# Patient Record
Sex: Female | Born: 1970 | Race: Black or African American | Hispanic: No | Smoking: Never smoker
Health system: Southern US, Community
[De-identification: ages and names within clinical notes are randomized; demographics above are authoritative.]

## PROBLEM LIST (undated history)

## (undated) DIAGNOSIS — R6520 Severe sepsis without septic shock: Secondary | ICD-10-CM

## (undated) DIAGNOSIS — J690 Pneumonitis due to inhalation of food and vomit: Secondary | ICD-10-CM

## (undated) DIAGNOSIS — I10 Essential (primary) hypertension: Secondary | ICD-10-CM

## (undated) DIAGNOSIS — J9621 Acute and chronic respiratory failure with hypoxia: Secondary | ICD-10-CM

## (undated) DIAGNOSIS — A419 Sepsis, unspecified organism: Secondary | ICD-10-CM

## (undated) DIAGNOSIS — N189 Chronic kidney disease, unspecified: Secondary | ICD-10-CM

## (undated) DIAGNOSIS — N179 Acute kidney failure, unspecified: Secondary | ICD-10-CM

## (undated) DIAGNOSIS — E119 Type 2 diabetes mellitus without complications: Secondary | ICD-10-CM

## (undated) DIAGNOSIS — B259 Cytomegaloviral disease, unspecified: Secondary | ICD-10-CM

## (undated) HISTORY — DX: Sepsis, unspecified organism: R65.20

## (undated) HISTORY — DX: Chronic kidney disease, unspecified: N18.9

## (undated) HISTORY — DX: Acute and chronic respiratory failure with hypoxia: J96.21

## (undated) HISTORY — DX: Pneumonitis due to inhalation of food and vomit: J69.0

## (undated) HISTORY — DX: Cytomegaloviral disease, unspecified: B25.9

## (undated) HISTORY — DX: Sepsis, unspecified organism: A41.9

## (undated) HISTORY — DX: Acute kidney failure, unspecified: N17.9

---

## 2003-07-24 ENCOUNTER — Other Ambulatory Visit: Payer: Self-pay

## 2003-10-17 ENCOUNTER — Other Ambulatory Visit: Payer: Self-pay

## 2006-07-13 ENCOUNTER — Emergency Department: Payer: Self-pay | Admitting: Emergency Medicine

## 2006-07-26 ENCOUNTER — Other Ambulatory Visit: Payer: Self-pay

## 2006-07-26 ENCOUNTER — Emergency Department: Payer: Self-pay | Admitting: Emergency Medicine

## 2006-09-15 ENCOUNTER — Emergency Department: Payer: Self-pay

## 2006-11-14 ENCOUNTER — Emergency Department: Payer: Self-pay | Admitting: Emergency Medicine

## 2007-08-04 HISTORY — PX: HEART TRANSPLANT: SHX268

## 2007-08-04 HISTORY — PX: NEPHRECTOMY TRANSPLANTED ORGAN: SUR880

## 2011-11-05 ENCOUNTER — Ambulatory Visit: Payer: Self-pay | Admitting: Obstetrics and Gynecology

## 2012-11-15 ENCOUNTER — Ambulatory Visit: Payer: Self-pay | Admitting: Obstetrics and Gynecology

## 2014-04-10 ENCOUNTER — Ambulatory Visit: Payer: Self-pay | Admitting: Obstetrics and Gynecology

## 2014-05-10 ENCOUNTER — Emergency Department: Payer: Self-pay | Admitting: Emergency Medicine

## 2015-04-01 ENCOUNTER — Other Ambulatory Visit: Payer: Self-pay | Admitting: Obstetrics and Gynecology

## 2015-04-01 DIAGNOSIS — Z1231 Encounter for screening mammogram for malignant neoplasm of breast: Secondary | ICD-10-CM

## 2015-04-04 ENCOUNTER — Encounter: Payer: BLUE CROSS/BLUE SHIELD | Attending: Family Medicine | Admitting: Dietician

## 2015-04-04 ENCOUNTER — Encounter: Payer: Self-pay | Admitting: Dietician

## 2015-04-04 VITALS — Ht 59.0 in | Wt 90.3 lb

## 2015-04-04 DIAGNOSIS — E1040 Type 1 diabetes mellitus with diabetic neuropathy, unspecified: Secondary | ICD-10-CM | POA: Diagnosis not present

## 2015-04-04 DIAGNOSIS — I10 Essential (primary) hypertension: Secondary | ICD-10-CM

## 2015-04-04 DIAGNOSIS — E785 Hyperlipidemia, unspecified: Secondary | ICD-10-CM

## 2015-04-04 DIAGNOSIS — E119 Type 2 diabetes mellitus without complications: Secondary | ICD-10-CM | POA: Insufficient documentation

## 2015-04-04 NOTE — Progress Notes (Signed)
Medical Nutrition Therapy: Visit start time: 1615  end time: 1730   Assessment:  Diagnosis: Hyperlipidemia, Diabetes (on pump), HTN Past medical history: heart and kidney transplant 2009, GERD, osteoarthritis, avascular necrosis of bone in right hip, hyperthyroidism Psychosocial issues/ stress concerns: patient reports high stress level, taking meds. History of anxiety and depression. Preferred learning method:  . Visual  Current weight: 90.3lbs  Height: 4'11" Medications, supplements: reviewed list in chart with patient Progress and evaluation: Lab results indicate total cholesterol of 264, LDL 195, HDL 40.5, TG 141.    Patient reports she does not eat healthy, does not like fruits, nuts, yogurt, peanut butter. Does like vegetables.    She reports living alone and has had frustration trying to cook for herself without wasting foods, so usually eats out.  Physical activity: none, hip pain from arthritis, necrosis of bone  Dietary Intake:  Usual eating pattern includes 3 meals and 0 snacks per day. Dining out frequency: multiple meals per week.  Breakfast: sometimes bacon, biscuits ([place]), eggs scrambled not many eggs, loves cheese.  Snack: none Lunch: [employer] 1 chicken wing, mashed potatoes, biscuit with gravy. Usually out Snack: none Supper: 8/31 cheese fries.  Snack: none Beverages: sweet tea, mt dew. No diet drinks.   Nutrition Care Education: Topics covered: hyperlipidemia, Diabetes, HTN Basic nutrition: basic food groups, appropriate nutrient balance, general nutrition guidelines    Diabetes:  appropriate carb intake and balance Hypertension:  identifying high sodium foods, identifying food sources of Calcium, potassium, magnesium Hyperlipidemia:  target goals for lipids, healthy and unhealthy fats, role of fiber, food sources of folate, phytochemicals   Nutritional Diagnosis:  NI-5.6.2 Excessive fat intake As related to regular intake of fried foods, biscuits,  processed meats, cheese.  As evidenced by patient report.  Intervention: Instruction as noted above.   Discussed importance of reducing saturated and trans fats, sodium, while increasing vegetables, whole grains.   Discussed simple meal options patient can prepare at home, and ways to limit waste.   Discussed importance of limiting sugary beverages for BG control as well as heart disease risk.     Education Materials given:  . General diet guidelines for Cholesterol-lowering/ Heart health . Food lists/ Planning A Balanced Meal . Sample meal pattern/ menus . Goals/ instructions  Learner/ who was taught:  . Patient   Level of understanding: Marland Kitchen Verbalizes/ demonstrates competency . Needs additional support in making gradual changes -- scheduled follow-up  Demonstrated degree of understanding via:   Teach back Learning barriers: . Hearing impairment; uses hearing aids  Willingness to learn/ readiness for change: . Acceptance, ready for change   Monitoring and Evaluation:  Dietary intake, BG control, cholesterol, and body weight      follow up: 05/06/15

## 2015-04-04 NOTE — Patient Instructions (Signed)
   Try Old Fashioned oats, mix 1/2 cup with 1/2 cup water or low fat milk and keep it in the fridge overnight. Sweeten it with Splenda and a small amount of fresh or frozen fruit.   Eat a vegetable several times a day.   Eat more baked and grilled foods, reduce fried foods.   Use drinks with Splenda, diet soda, or water.

## 2015-04-16 ENCOUNTER — Ambulatory Visit
Admission: RE | Admit: 2015-04-16 | Discharge: 2015-04-16 | Disposition: A | Discharge status: Home / Self Care | Payer: BLUE CROSS/BLUE SHIELD | Source: Ambulatory Visit | Attending: Obstetrics and Gynecology | Admitting: Obstetrics and Gynecology

## 2015-04-16 DIAGNOSIS — Z1231 Encounter for screening mammogram for malignant neoplasm of breast: Secondary | ICD-10-CM | POA: Insufficient documentation

## 2015-05-06 ENCOUNTER — Encounter: Payer: BLUE CROSS/BLUE SHIELD | Attending: Family Medicine | Admitting: Dietician

## 2015-05-06 VITALS — Ht 59.0 in | Wt 88.6 lb

## 2015-05-06 DIAGNOSIS — E1040 Type 1 diabetes mellitus with diabetic neuropathy, unspecified: Secondary | ICD-10-CM

## 2015-05-06 DIAGNOSIS — E785 Hyperlipidemia, unspecified: Secondary | ICD-10-CM | POA: Diagnosis not present

## 2015-05-06 DIAGNOSIS — E119 Type 2 diabetes mellitus without complications: Secondary | ICD-10-CM | POA: Insufficient documentation

## 2015-05-06 DIAGNOSIS — I10 Essential (primary) hypertension: Secondary | ICD-10-CM | POA: Diagnosis not present

## 2015-05-06 NOTE — Progress Notes (Signed)
Medical Nutrition Therapy: Visit start time: 1600  end time: 1700  Assessment:  Diagnosis: Type 1 Diabetes Medical history changes: none Psychosocial issues/ stress concerns: none  Current weight: 88.6lbs  Height: 4'11" Medications, supplement changes: none per patient  Progress and evaluation: Patient reports fewer sugar-sweetened drinks, more water         Fewer fried foods, usually grilled or rotisserie chicken.         Tried egg beaters, 2% cheese.         She has also tried some fruits, but only likes watermelon.          .            Physical activity: none  Dietary Intake:  Usual eating pattern includes 3 meals and 1-2 snacks per day. Dining out frequency: 10 meals per week.  Breakfast: biscuits, croissants.  Snack: sometimes cheezits crackers Lunch: 10/3 deli ham on roll whole grain with cheese Snack: none Supper: macaroni, corn, rotisserie chicken, salad with lettuce tomato and cheese Snack: jello or pudding (not sugar free) Beverages: apple juice, water, some sweet tea, occasional soda  Nutrition Care Education: Topics covered: Diabetes, heart healthy eating Basic nutrition: appropriate nutrient balance Advanced nutrition: cooking techniques, dining out, food label reading Diabetes: appropriate carb intake and balance, options for decreasing sugar in beverages Heart health: options for including vegetables and fruits  Nutritional Diagnosis:  NI-5.8.2 Excessive carbohydrate intake As related to sugar-sweetened beverages.  As evidenced by patient report, fluctuating blood sugars.  Intervention: Discussion as noted above.    Patient will continue to make diet improvements as listed in patient instructions.   Patient reports feeling good about her current BG control.  Education Materials given:  Marland Kitchen Goals/ instructions  Learner/ who was taught:  . Patient   Level of understanding: Marland Kitchen Verbalizes/ demonstrates competency  Demonstrated degree of understanding via:    Teach back Learning barriers: . Hearing impairment  Willingness to learn/ readiness for change: . Eager, change in progress   Monitoring and Evaluation:  Dietary intake, exercise, BG control, and body weight      follow up: prn

## 2015-05-06 NOTE — Patient Instructions (Addendum)
   Try stir-fried vegetables such as zucchini and squash. Keep eating plenty of salads, greens, green beans.   Keep decreasing sugar from drinks.   Try Healthy Balance juices, they are very low in sugar.   Use whole grain/ whole wheat English muffins in place of biscuits, at least some of the time.

## 2015-06-20 ENCOUNTER — Telehealth: Payer: Self-pay | Admitting: Dietician

## 2015-06-20 NOTE — Telephone Encounter (Signed)
Attempted to call patient to check on progress with BG control and weight, as her weight had decreased at the time of her last RD visit on 05/06/15.

## 2016-02-24 ENCOUNTER — Other Ambulatory Visit
Admission: RE | Admit: 2016-02-24 | Discharge: 2016-02-24 | Disposition: A | Discharge status: Home / Self Care | Payer: Managed Care, Other (non HMO) | Source: Ambulatory Visit | Attending: Nurse Practitioner | Admitting: Nurse Practitioner

## 2016-02-24 DIAGNOSIS — R197 Diarrhea, unspecified: Secondary | ICD-10-CM | POA: Diagnosis present

## 2016-02-24 LAB — GASTROINTESTINAL PANEL BY PCR, STOOL (REPLACES STOOL CULTURE)
ADENOVIRUS F40/41: NOT DETECTED
Astrovirus: NOT DETECTED
CRYPTOSPORIDIUM: NOT DETECTED
CYCLOSPORA CAYETANENSIS: NOT DETECTED
Campylobacter species: NOT DETECTED
E. coli O157: NOT DETECTED
ENTEROPATHOGENIC E COLI (EPEC): NOT DETECTED
Entamoeba histolytica: NOT DETECTED
Enteroaggregative E coli (EAEC): NOT DETECTED
Enterotoxigenic E coli (ETEC): NOT DETECTED
Giardia lamblia: NOT DETECTED
Norovirus GI/GII: NOT DETECTED
Plesimonas shigelloides: NOT DETECTED
ROTAVIRUS A: NOT DETECTED
SALMONELLA SPECIES: NOT DETECTED
SAPOVIRUS (I, II, IV, AND V): NOT DETECTED
SHIGA LIKE TOXIN PRODUCING E COLI (STEC): NOT DETECTED
SHIGELLA/ENTEROINVASIVE E COLI (EIEC): NOT DETECTED
VIBRIO SPECIES: NOT DETECTED
Vibrio cholerae: NOT DETECTED
YERSINIA ENTEROCOLITICA: NOT DETECTED

## 2016-02-24 LAB — C DIFFICILE QUICK SCREEN W PCR REFLEX
C Diff antigen: NEGATIVE
C Diff interpretation: NOT DETECTED
C Diff toxin: NEGATIVE

## 2016-03-26 ENCOUNTER — Other Ambulatory Visit: Payer: Self-pay | Admitting: Obstetrics and Gynecology

## 2016-03-26 DIAGNOSIS — Z1231 Encounter for screening mammogram for malignant neoplasm of breast: Secondary | ICD-10-CM

## 2016-05-13 ENCOUNTER — Ambulatory Visit
Admission: RE | Admit: 2016-05-13 | Discharge: 2016-05-13 | Disposition: A | Discharge status: Home / Self Care | Payer: Managed Care, Other (non HMO) | Source: Ambulatory Visit | Attending: Obstetrics and Gynecology | Admitting: Obstetrics and Gynecology

## 2016-05-13 DIAGNOSIS — Z1231 Encounter for screening mammogram for malignant neoplasm of breast: Secondary | ICD-10-CM | POA: Diagnosis not present

## 2017-04-15 ENCOUNTER — Other Ambulatory Visit: Payer: Self-pay | Admitting: Obstetrics and Gynecology

## 2017-04-15 DIAGNOSIS — Z1231 Encounter for screening mammogram for malignant neoplasm of breast: Secondary | ICD-10-CM

## 2017-05-24 ENCOUNTER — Ambulatory Visit
Admission: RE | Admit: 2017-05-24 | Discharge: 2017-05-24 | Disposition: A | Discharge status: Home / Self Care | Payer: Managed Care, Other (non HMO) | Source: Ambulatory Visit | Attending: Obstetrics and Gynecology | Admitting: Obstetrics and Gynecology

## 2017-05-24 DIAGNOSIS — Z1231 Encounter for screening mammogram for malignant neoplasm of breast: Secondary | ICD-10-CM | POA: Diagnosis present

## 2017-07-30 ENCOUNTER — Encounter: Payer: Self-pay | Admitting: Emergency Medicine

## 2017-07-30 ENCOUNTER — Other Ambulatory Visit: Payer: Self-pay

## 2017-07-30 ENCOUNTER — Emergency Department
Admission: EM | Admit: 2017-07-30 | Discharge: 2017-07-30 | Disposition: A | Discharge status: Other Acute Inpatient Hospital | Payer: 59 | Attending: Emergency Medicine | Admitting: Emergency Medicine

## 2017-07-30 ENCOUNTER — Emergency Department: Payer: 59

## 2017-07-30 DIAGNOSIS — R531 Weakness: Secondary | ICD-10-CM | POA: Diagnosis present

## 2017-07-30 DIAGNOSIS — N289 Disorder of kidney and ureter, unspecified: Secondary | ICD-10-CM | POA: Insufficient documentation

## 2017-07-30 DIAGNOSIS — Z794 Long term (current) use of insulin: Secondary | ICD-10-CM | POA: Insufficient documentation

## 2017-07-30 DIAGNOSIS — Z7982 Long term (current) use of aspirin: Secondary | ICD-10-CM | POA: Diagnosis not present

## 2017-07-30 DIAGNOSIS — E119 Type 2 diabetes mellitus without complications: Secondary | ICD-10-CM | POA: Diagnosis not present

## 2017-07-30 DIAGNOSIS — I10 Essential (primary) hypertension: Secondary | ICD-10-CM | POA: Diagnosis not present

## 2017-07-30 DIAGNOSIS — R05 Cough: Secondary | ICD-10-CM | POA: Insufficient documentation

## 2017-07-30 DIAGNOSIS — Z79899 Other long term (current) drug therapy: Secondary | ICD-10-CM | POA: Insufficient documentation

## 2017-07-30 DIAGNOSIS — R059 Cough, unspecified: Secondary | ICD-10-CM

## 2017-07-30 HISTORY — DX: Essential (primary) hypertension: I10

## 2017-07-30 HISTORY — DX: Type 2 diabetes mellitus without complications: E11.9

## 2017-07-30 LAB — COMPREHENSIVE METABOLIC PANEL
ALK PHOS: 87 U/L (ref 38–126)
ALT: 18 U/L (ref 14–54)
ANION GAP: 18 — AB (ref 5–15)
AST: 34 U/L (ref 15–41)
Albumin: 3.7 g/dL (ref 3.5–5.0)
BILIRUBIN TOTAL: 0.9 mg/dL (ref 0.3–1.2)
BUN: 92 mg/dL — ABNORMAL HIGH (ref 6–20)
CALCIUM: 9.1 mg/dL (ref 8.9–10.3)
CO2: 16 mmol/L — ABNORMAL LOW (ref 22–32)
Chloride: 100 mmol/L — ABNORMAL LOW (ref 101–111)
Creatinine, Ser: 3.14 mg/dL — ABNORMAL HIGH (ref 0.44–1.00)
GFR, EST AFRICAN AMERICAN: 20 mL/min — AB (ref >60)
GFR, EST NON AFRICAN AMERICAN: 17 mL/min — AB (ref >60)
GLUCOSE: 161 mg/dL — AB (ref 65–99)
Potassium: 4.2 mmol/L (ref 3.5–5.1)
Sodium: 134 mmol/L — ABNORMAL LOW (ref 135–145)
TOTAL PROTEIN: 8.9 g/dL — AB (ref 6.5–8.1)

## 2017-07-30 LAB — CBC WITH DIFFERENTIAL/PLATELET
Basophils Absolute: 0.1 10*3/uL (ref 0–0.1)
Basophils Relative: 1 %
Eosinophils Absolute: 0 10*3/uL (ref 0–0.7)
Eosinophils Relative: 0 %
HEMATOCRIT: 39.9 % (ref 35.0–47.0)
HEMOGLOBIN: 12.8 g/dL (ref 12.0–16.0)
LYMPHS ABS: 1.7 10*3/uL (ref 1.0–3.6)
Lymphocytes Relative: 12 %
MCH: 24.6 pg — AB (ref 26.0–34.0)
MCHC: 32.2 g/dL (ref 32.0–36.0)
MCV: 76.4 fL — ABNORMAL LOW (ref 80.0–100.0)
MONO ABS: 1.3 10*3/uL — AB (ref 0.2–0.9)
MONOS PCT: 9 %
NEUTROS ABS: 10.5 10*3/uL — AB (ref 1.4–6.5)
NEUTROS PCT: 78 %
Platelets: 294 10*3/uL (ref 150–440)
RBC: 5.22 MIL/uL — ABNORMAL HIGH (ref 3.80–5.20)
RDW: 16.5 % — ABNORMAL HIGH (ref 11.5–14.5)
WBC: 13.6 10*3/uL — ABNORMAL HIGH (ref 3.6–11.0)

## 2017-07-30 MED ORDER — SODIUM CHLORIDE 0.9 % IV BOLUS (SEPSIS)
1000.0000 mL | Freq: Once | INTRAVENOUS | Status: AC
  Administered 2017-07-30: 1000 mL via INTRAVENOUS

## 2017-07-30 NOTE — ED Triage Notes (Signed)
FIRST NURSE NOTE-pt thinks needs IV fluids and is dehydrated. Poor appetite. General weakness. Denies fever. Thinks viral illness. Is heart and kidney transplant pt.

## 2017-07-30 NOTE — ED Notes (Signed)
Pt c/o cold like symptoms xfew days, denies fever or pain. Pt hx of heart and kidney transplant

## 2017-07-30 NOTE — ED Provider Notes (Signed)
[facility] Emergency Department Provider Note  Time seen: 7:20 PM  I have reviewed the triage vital signs and the nursing notes.   HISTORY  Chief Complaint Weakness    HPI Kelsey Allen is a 46 y.o. female with a past medical history of diabetes, hypertension, status post kidney and heart transplant who presents to the emergency department for cough and congestion.  According to the patient for the past 1 week she has been experiencing cough and congestion as well as generalized fatigue/weakness.  Denies any fever.  Denies sputum production.  Review of systems otherwise largely negative.  Currently the patient appears well, no distress lying in bed comfortably.  Patient states she has a decreased appetite over the past few days.   Past Medical History:  Diagnosis Date  . Diabetes mellitus without complication (HCC)   . Hypertension     There are no active problems to display for this patient.   Past Surgical History:  Procedure Laterality Date  . HEART TRANSPLANT  2009  . NEPHRECTOMY TRANSPLANTED ORGAN  2009    Prior to Admission medications   Medication Sig Start Date End Date Taking? Authorizing Provider  aspirin 81 MG tablet Take by mouth. 12/02/07   [provider]  atorvastatin (LIPITOR) 40 MG tablet  04/01/15   [provider]  carvedilol (COREG) 25 MG tablet Take by mouth. 01/10/13   [provider]  insulin lispro (HUMALOG) 100 UNIT/ML injection Inject into the skin.    [provider]  lisinopril (PRINIVIL,ZESTRIL) 10 MG tablet Take 10 mg by mouth daily.    [provider]  medroxyPROGESTERone (DEPO-PROVERA) 150 MG/ML injection Inject into the muscle. 03/14/15   [provider]  mycophenolate (CELLCEPT) 200 MG/ML suspension TAKE 1 TEASPOONFUL (5 ML) BY MOUTH TWICE DAILY Generic: Mycophenolate Mofetil 10/22/11   [provider]  Omeprazole (PRILOSEC PO) Take by mouth.    [provider]  predniSONE (DELTASONE) 2.5 MG tablet Take by mouth. 06/06/13   [provider]  PROGRAF 1 MG capsule  03/01/15   [provider]  sertraline (ZOLOFT) 25 MG tablet Take by mouth. 01/09/14   [provider]    Allergies  Allergen Reactions  . Penicillins Hives    No family history on file.  Social History Social History   Tobacco Use  . Smoking status: Never Smoker  . Smokeless tobacco: Never Used  Substance Use Topics  . Alcohol use: No    Alcohol/week: 0.0 oz  . Drug use: Not on file    Review of Systems Constitutional: Negative for fever. Eyes: Negative for visual changes. ENT: Positive for congestion Cardiovascular: Negative for chest pain. Respiratory: Positive for cough but negative for sputum production Gastrointestinal: Negative for abdominal pain GU: Negative for dysuria Musculoskeletal: Negative for back pain. Skin: Negative for rash. Neurological: Mild headache All other ROS negative  ____________________________________________   PHYSICAL EXAM:  VITAL SIGNS: ED Triage Vitals [07/30/17 1813]  Enc Vitals Group     BP 107/71     Pulse Rate (!) 102     Resp 15     Temp 98.6 F (37 C)     Temp Source Oral     SpO2 97 %     Weight 85 lb (38.6 kg)     Height 4\' 11"  (1.499 m)     Head Circumference      Peak Flow      Pain Score 0  Pain Loc      Pain Edu?      Excl. in GC?     Constitutional: Alert and oriented. Well appearing and in no distress. Eyes: Normal exam ENT   Head: Normocephalic and atraumatic.   Nose: No congestion/rhinnorhea.   Mouth/Throat: Mucous membranes are moist. Cardiovascular: regular rhythm, rate around 100 bpm. Respiratory: Normal respiratory effort without tachypnea nor retractions. Breath sounds are clear.  No wheeze rales or rhonchi. Gastrointestinal: Soft and nontender. No distention.   Musculoskeletal: Nontender with normal range of motion in all extremities. No  lower extremity tenderness or edema. Neurologic:  Normal speech and language. No gross focal neurologic deficits  Skin:  Skin is warm, dry and intact.  Psychiatric: Mood and affect are normal.   ____________________________________________  RADIOLOGY  Chest x-ray most consistent with possible bronchitis, no focal consolidation.  ____________________________________________   INITIAL IMPRESSION / ASSESSMENT AND PLAN / ED COURSE  Pertinent labs & imaging results that were available during my care of the patient were reviewed by me and considered in my medical decision making (see chart for details).  Patient presents to the emergency department for cough and congestion for the past 7 days.  Patient is status post kidney and heart transplant.  Differential would include upper respiratory infection, viral illness, pneumonia, pneumothorax, pulmonary edema.  We will check labs, chest x-ray and continue to closely monitor.  We will IV hydrate while awaiting workup results.  Patient agreeable to this plan of care.  X-ray consistent with a possible peribronchial streaking, could be bronchitis.  Negative for pneumonia.  Patient's labs show mild white blood cell count elevation which is largely baseline given her prednisone use.  Patient CMP creatinine is 3.1 today patient's baseline appears to be around 1.5-1.7.  Given the patient's history of renal transplant we will discuss with [facility] transplant team for further evaluation.  Patient's anion gap is elevated at 18 with a bicarb of 16.  We are currently IV hydrating and will discuss with [facility] for further recommendations as far as antibiotics and discharge home with close follow-up versus transfer.  I discussed this plan of care with the patient and she is agreeable.  I discussed the patient with [facility] who has discussed it with the heart transplant team and they would like the patient to be transferred to their facility for further treatment and monitoring.   I discussed this with the patient and she is agreeable to this plan.  Will transfer once a bed is assigned.  I will hold off on antibiotics until [facility] reass[facility] to decide if appropriate. ____________________________________________   FINAL CLINICAL IMPRESSION(S) / ED DIAGNOSES  Cough/congestion Acute renal insufficiency   Minna AntisPaduchowski, Jemia Fata, MD 07/30/17 2149

## 2017-07-30 NOTE — ED Triage Notes (Addendum)
Pt reports cough x1 week. Pt reports decreased appetite. Pt reports cough is nonproductive, denies fever, body aches or chills. Pt denies any sinus congestion. Pt is heart and kidney transplant pt in 2009.

## 2017-07-30 NOTE — ED Notes (Signed)
Pt given juice and crackers at this time per MD

## 2017-07-30 NOTE — ED Notes (Signed)
Mother - Ivan CroftFontaine Mormile [phone]

## 2018-04-14 ENCOUNTER — Other Ambulatory Visit: Payer: Self-pay | Admitting: Obstetrics and Gynecology

## 2018-04-14 ENCOUNTER — Encounter: Payer: 59 | Attending: Physician Assistant | Admitting: Physician Assistant

## 2018-04-14 DIAGNOSIS — E1140 Type 2 diabetes mellitus with diabetic neuropathy, unspecified: Secondary | ICD-10-CM | POA: Insufficient documentation

## 2018-04-14 DIAGNOSIS — Z888 Allergy status to other drugs, medicaments and biological substances status: Secondary | ICD-10-CM | POA: Diagnosis not present

## 2018-04-14 DIAGNOSIS — N186 End stage renal disease: Secondary | ICD-10-CM | POA: Insufficient documentation

## 2018-04-14 DIAGNOSIS — Z940 Kidney transplant status: Secondary | ICD-10-CM | POA: Insufficient documentation

## 2018-04-14 DIAGNOSIS — I120 Hypertensive chronic kidney disease with stage 5 chronic kidney disease or end stage renal disease: Secondary | ICD-10-CM | POA: Insufficient documentation

## 2018-04-14 DIAGNOSIS — Z881 Allergy status to other antibiotic agents status: Secondary | ICD-10-CM | POA: Insufficient documentation

## 2018-04-14 DIAGNOSIS — Z955 Presence of coronary angioplasty implant and graft: Secondary | ICD-10-CM | POA: Insufficient documentation

## 2018-04-14 DIAGNOSIS — Z880 Allergy status to penicillin: Secondary | ICD-10-CM | POA: Insufficient documentation

## 2018-04-14 DIAGNOSIS — E11621 Type 2 diabetes mellitus with foot ulcer: Secondary | ICD-10-CM | POA: Insufficient documentation

## 2018-04-14 DIAGNOSIS — Z941 Heart transplant status: Secondary | ICD-10-CM | POA: Insufficient documentation

## 2018-04-14 DIAGNOSIS — L97822 Non-pressure chronic ulcer of other part of left lower leg with fat layer exposed: Secondary | ICD-10-CM | POA: Diagnosis present

## 2018-04-14 DIAGNOSIS — E1122 Type 2 diabetes mellitus with diabetic chronic kidney disease: Secondary | ICD-10-CM | POA: Diagnosis not present

## 2018-04-14 DIAGNOSIS — Z1231 Encounter for screening mammogram for malignant neoplasm of breast: Secondary | ICD-10-CM

## 2018-04-19 NOTE — Progress Notes (Addendum)
IZZA, BICKLE ([id number]) Visit Report for 04/14/2018 Allergy List Details Patient Name: Kelsey Allen, Kelsey Allen. Date of Service: 04/14/2018 1:00 PM Medical Record Number: [id number] Patient Account Number: [id number] Date of Birth/Sex: May 30, 1971 (47 y.o. F) Treating RN: Curtis Sites Primary Care Vicki Pasqual: Marisue Ivan Other Clinician: Referring Spenser Cong: Marisue Ivan Treating Phyliss Hulick/Extender: Linwood Dibbles, HOYT Weeks in Treatment: 0 Allergies Active Allergies lisinopril penicillin Zithromax Allergy Notes Electronic Signature(s) Signed: 04/14/2018 12:54:26 PM By: Curtis Sites Entered By: Curtis Sites on 04/14/2018 12:54:25 Kelsey Allen ([id number]) -------------------------------------------------------------------------------- Arrival Information Details Patient Name: Kelsey Allen. Date of Service: 04/14/2018 1:00 PM Medical Record Number: [id number] Patient Account Number: [id number] Date of Birth/Sex: 08/05/1970 (47 y.o. F) Treating RN: Renne Crigler Primary Care Graysyn Bache: Marisue Ivan Other Clinician: Referring Arsh Feutz: Marisue Ivan Treating Caterine Mcmeans/Extender: Linwood Dibbles, HOYT Weeks in Treatment: 0 Visit Information Patient Arrived: Ambulatory Arrival Time: 13:03 Accompanied By: mother Transfer Assistance: None Patient Identification Verified: Yes Secondary Verification Process Completed: Yes Patient Has Alerts: Yes Patient Alerts: DMII aspirin 81 Electronic Signature(s) Signed: 04/14/2018 5:06:40 PM By: Curtis Sites Entered By: Curtis Sites on 04/14/2018 13:08:32 Kelsey Allen ([id number]) -------------------------------------------------------------------------------- Clinic Level of Care Assessment Details Patient Name: Kelsey Allen. Date of Service: 04/14/2018 1:00 PM Medical Record Number: [id number] Patient Account Number: [id number] Date of Birth/Sex: 01-Apr-1971 (47 y.o. F) Treating RN: Renne Crigler Primary Care  Heyden Jaber: Marisue Ivan Other Clinician: Referring Quantavis Obryant: Marisue Ivan Treating Richanda Darin/Extender: Linwood Dibbles, HOYT Weeks in Treatment: 0 Clinic Level of Care Assessment Items TOOL 1 Quantity Score X - Use when EandM and Procedure is performed on INITIAL visit 1 0 ASSESSMENTS - Nursing Assessment / Reassessment X - General Physical Exam (combine w/ comprehensive assessment (listed just below) when 1 20 performed on new pt. evals) X- 1 25 Comprehensive Assessment (HX, ROS, Risk Assessments, Wounds Hx, etc.) ASSESSMENTS - Wound and Skin Assessment / Reassessment []  - Dermatologic / Skin Assessment (not related to wound area) 0 ASSESSMENTS - Ostomy and/or Continence Assessment and Care []  - Incontinence Assessment and Management 0 []  - 0 Ostomy Care Assessment and Management (repouching, etc.) PROCESS - Coordination of Care X - Simple Patient / Family Education for ongoing care 1 15 []  - 0 Complex (extensive) Patient / Family Education for ongoing care []  - 0 Staff obtains Chiropractor, Records, Test Results / Process Orders []  - 0 Staff telephones HHA, Nursing Homes / Clarify orders / etc []  - 0 Routine Transfer to another Facility (non-emergent condition) []  - 0 Routine Hospital Admission (non-emergent condition) []  - 0 New Admissions / Manufacturing engineer / Ordering NPWT, Apligraf, etc. []  - 0 Emergency Hospital Admission (emergent condition) PROCESS - Special Needs []  - Pediatric / Minor Patient Management 0 []  - 0 Isolation Patient Management []  - 0 Hearing / Language / Visual special needs []  - 0 Assessment of Community assistance (transportation, D/C planning, etc.) []  - 0 Additional assistance / Altered mentation []  - 0 Support Surface(s) Assessment (bed, cushion, seat, etc.) Kelsey Allen, Kelsey M. ([id number]) INTERVENTIONS - Miscellaneous []  - External ear exam 0 []  - 0 Patient Transfer (multiple staff / Nurse, adult / Similar devices) []  - 0 Simple  Staple / Suture removal (25 or less) []  - 0 Complex Staple / Suture removal (26 or more) []  - 0 Hypo/Hyperglycemic Management (do not check if billed separately) X- 1 15 Ankle / Brachial Index (ABI) - do not check if billed separately Has the patient been seen at the hospital within the last three years: Yes Total  Score: 75 Level Of Care: New/Established - Level 2 Electronic Signature(s) Signed: 04/15/2018 8:12:43 AM By: Renne Crigler Entered By: Renne Crigler on 04/14/2018 14:22:07 Kelsey Allen ([id number]) -------------------------------------------------------------------------------- Encounter Discharge Information Details Patient Name: Kelsey Allen. Date of Service: 04/14/2018 1:00 PM Medical Record Number: [id number] Patient Account Number: [id number] Date of Birth/Sex: 1971-04-18 (47 y.o. F) Treating RN: Curtis Sites Primary Care Broedy Osbourne: Marisue Ivan Other Clinician: Referring Shatana Saxton: Marisue Ivan Treating Dezzie Badilla/Extender: Linwood Dibbles, HOYT Weeks in Treatment: 0 Encounter Discharge Information Items Discharge Condition: Stable Ambulatory Status: Ambulatory Discharge Destination: Home Transportation: Private Auto Accompanied By: mother Schedule Follow-up Appointment: Yes Clinical Summary of Care: Electronic Signature(s) Signed: 04/14/2018 5:06:40 PM By: Curtis Sites Entered By: Curtis Sites on 04/14/2018 14:41:31 Kelsey Allen, Kelsey M. ([id number]) -------------------------------------------------------------------------------- Lower Extremity Assessment Details Patient Name: Kelsey Allen. Date of Service: 04/14/2018 1:00 PM Medical Record Number: [id number] Patient Account Number: [id number] Date of Birth/Sex: [birth year] (47 y.o. F) Treating RN: Curtis Sites Primary Care Isahia Hollerbach: Marisue Ivan Other Clinician: Referring Cotina Freedman: Marisue Ivan Treating Oneda Duffett/Extender: Linwood Dibbles, HOYT Weeks in Treatment: 0 Edema  Assessment Assessed: [Left: No] [Right: No] Edema: [Left: Yes] [Right: Yes] Calf Left: Right: Point of Measurement: 32 cm From Medial Instep 27.2 cm 26.8 cm Ankle Left: Right: Point of Measurement: 10 cm From Medial Instep 20.4 cm 19.2 cm Vascular Assessment Pulses: Dorsalis Pedis Palpable: [Left:Yes] [Right:Yes] Doppler Audible: [Left:Yes] [Right:Yes] Posterior Tibial Palpable: [Left:Yes] [Right:Yes] Doppler Audible: [Left:Yes] [Right:Yes] Extremity colors, hair growth, and conditions: Extremity Color: [Left:Normal] [Right:Normal] Hair Growth on Extremity: [Left:Yes] [Right:Yes] Temperature of Extremity: [Left:Warm] [Right:Warm] Capillary Refill: [Left:< 3 seconds] [Right:< 3 seconds] Blood Pressure: Brachial: [Left:156] Dorsalis Pedis: 190 [Left:Dorsalis Pedis: 186] Ankle: Posterior Tibial: 172 [Left:Posterior Tibial: 172 1.22] [Right:1.19] Toe Nail Assessment Left: Right: Thick: No No Discolored: No No Deformed: No No Improper Length and Hygiene: No No Electronic Signature(s) Signed: 04/14/2018 5:06:40 PM By: Curtis Sites Entered By: Curtis Sites on 04/14/2018 13:37:14 Kelsey Allen, Kelsey M. ([id number]) Kelsey Allen, Kelsey M. ([id number]) -------------------------------------------------------------------------------- Multi Wound Chart Details Patient Name: Kelsey Allen. Date of Service: 04/14/2018 1:00 PM Medical Record Number: [id number] Patient Account Number: [id number] Date of Birth/Sex: [birth year] (47 y.o. F) Treating RN: Renne Crigler Primary Care Karys Meckley: Marisue Ivan Other Clinician: Referring Wilmore Holsomback: Marisue Ivan Treating Akhil Piscopo/Extender: Linwood Dibbles, HOYT Weeks in Treatment: 0 Vital Signs Height(in): 59 Pulse(bpm): 111 Weight(lbs): 82 Blood Pressure(mmHg): 154/95 Body Mass Index(BMI): 17 Temperature(F): 98.4 Respiratory Rate 18 (breaths/min): Photos: [1:No Photos] [N/A:N/A] Wound Location: [1:Left Lower Leg - Anterior]  [N/A:N/A] Wounding Event: [1:Trauma] [N/A:N/A] Primary Etiology: [1:Diabetic Wound/Ulcer of the Lower Extremity] [N/A:N/A] Comorbid History: [1:Hypertension, Type II Diabetes, End Stage Renal Disease, Neuropathy] [N/A:N/A] Date Acquired: [1:12/13/2017] [N/A:N/A] Weeks of Treatment: [1:0] [N/A:N/A] Wound Status: [1:Open] [N/A:N/A] Measurements L x W x D [1:1x0.8x0.1] [N/A:N/A] (cm) Area (cm) : [1:0.628] [N/A:N/A] Volume (cm) : [1:0.063] [N/A:N/A] Classification: [1:Grade 1] [N/A:N/A] Exudate Amount: [1:Large] [N/A:N/A] Exudate Type: [1:Serous] [N/A:N/A] Exudate Color: [1:amber] [N/A:N/A] Wound Margin: [1:Flat and Intact] [N/A:N/A] Granulation Amount: [1:Small (1-33%)] [N/A:N/A] Granulation Quality: [1:Red] [N/A:N/A] Necrotic Amount: [1:Large (67-100%)] [N/A:N/A] Necrotic Tissue: [1:Eschar, Adherent Slough] [N/A:N/A] Exposed Structures: [1:Fat Layer (Subcutaneous Tissue) Exposed: Yes Fascia: No Tendon: No Muscle: No Joint: No Bone: No] [N/A:N/A] Epithelialization: [1:None] [N/A:N/A] Periwound Skin Texture: [1:Excoriation: No Induration: No Callus: No Crepitus: No] [N/A:N/A] Rash: No Scarring: No Periwound Skin Moisture: Maceration: No N/A N/A Dry/Scaly: No Periwound Skin Color: Ecchymosis: Yes N/A N/A Atrophie Blanche: No Cyanosis: No Erythema: No Hemosiderin Staining: No Mottled: No Pallor: No Rubor: No  Temperature: No Abnormality N/A N/A Tenderness on Palpation: No N/A N/A Wound Preparation: Ulcer Cleansing: N/A N/A Rinsed/Irrigated with Saline Topical Anesthetic Applied: Other: lidocaine 4% Treatment Notes Electronic Signature(s) Signed: 04/15/2018 8:12:43 AM By: Renne Crigler Entered By: Renne Crigler on 04/14/2018 14:09:43 Kelsey Allen ([id number]) -------------------------------------------------------------------------------- Multi-Disciplinary Care Plan Details Patient Name: Kelsey Allen. Date of Service: 04/14/2018 1:00 PM Medical Record  Number: [id number] Patient Account Number: [id number] Date of Birth/Sex: March 16, 1971 (47 y.o. F) Treating RN: Renne Crigler Primary Care Adi Seales: Marisue Ivan Other Clinician: Referring Darienne Belleau: Marisue Ivan Treating Jalene Demo/Extender: Linwood Dibbles, HOYT Weeks in Treatment: 0 Active Inactive ` Orientation to the Wound Care Program Nursing Diagnoses: Knowledge deficit related to the wound healing center program Goals: Patient/caregiver will verbalize understanding of the Wound Healing Center Program Date Initiated: 04/14/2018 Target Resolution Date: 05/05/2018 Goal Status: Active Interventions: Provide education on orientation to the wound center Notes: ` Wound/Skin Impairment Nursing Diagnoses: Impaired tissue integrity Goals: Patient/caregiver will verbalize understanding of skin care regimen Date Initiated: 04/14/2018 Target Resolution Date: 05/05/2018 Goal Status: Active Ulcer/skin breakdown will have a volume reduction of 30% by week 4 Date Initiated: 04/14/2018 Target Resolution Date: 05/05/2018 Goal Status: Active Interventions: Assess patient/caregiver ability to obtain necessary supplies Assess patient/caregiver ability to perform ulcer/skin care regimen upon admission and as needed Assess ulceration(s) every visit Treatment Activities: Skin care regimen initiated : 04/14/2018 Notes: Electronic Signature(s) Signed: 04/15/2018 8:12:43 AM By: Jeanene Erb, Kelsey M. ([id number]) Entered By: Renne Crigler on 04/14/2018 14:09:21 Kelsey Allen ([id number]) -------------------------------------------------------------------------------- Pain Assessment Details Patient Name: Kelsey Allen. Date of Service: 04/14/2018 1:00 PM Medical Record Number: [id number] Patient Account Number: [id number] Date of Birth/Sex: 03-23-1971 (47 y.o. F) Treating RN: Renne Crigler Primary Care Antoinne Spadaccini: Marisue Ivan Other Clinician: Referring Amyia Lodwick:  Marisue Ivan Treating Adalynn Corne/Extender: Linwood Dibbles, HOYT Weeks in Treatment: 0 Active Problems Location of Pain Severity and Description of Pain Patient Has Paino No Site Locations Pain Management and Medication Current Pain Management: Electronic Signature(s) Signed: 04/14/2018 5:06:40 PM By: Curtis Sites Signed: 04/15/2018 8:12:43 AM By: Renne Crigler Entered By: Curtis Sites on 04/14/2018 13:09:41 Kelsey Allen ([id number]) -------------------------------------------------------------------------------- Patient/Caregiver Education Details Patient Name: Kelsey Allen. Date of Service: 04/14/2018 1:00 PM Medical Record Number: [id number] Patient Account Number: [id number] Date of Birth/Gender: 1971-05-17 (47 y.o. F) Treating RN: Curtis Sites Primary Care Physician: Marisue Ivan Other Clinician: Referring Physician: Marisue Ivan Treating Physician/Extender: Skeet Simmer in Treatment: 0 Education Assessment Education Provided To: Patient and Caregiver Education Topics Provided Wound/Skin Impairment: Handouts: Other: wound care as ordered Methods: Demonstration, Explain/Verbal Responses: State content correctly Electronic Signature(s) Signed: 04/14/2018 5:06:40 PM By: Curtis Sites Entered By: Curtis Sites on 04/14/2018 14:41:51 Kelsey Allen, Kelsey M. ([id number]) -------------------------------------------------------------------------------- Wound Assessment Details Patient Name: Kelsey Allen. Date of Service: 04/14/2018 1:00 PM Medical Record Number: [id number] Patient Account Number: [id number] Date of Birth/Sex: Dec 03, 1970 (47 y.o. F) Treating RN: Curtis Sites Primary Care Marsela Kuan: Marisue Ivan Other Clinician: Referring Elle Vezina: Marisue Ivan Treating Javon Hupfer/Extender: Linwood Dibbles, HOYT Weeks in Treatment: 0 Wound Status Wound Number: 1 Primary Diabetic Wound/Ulcer of the Lower Extremity Etiology: Wound Location: Left  Lower Leg - Anterior Wound Open Wounding Event: Trauma Status: Date Acquired: 12/13/2017 Comorbid Hypertension, Type II Diabetes, End Stage Weeks Of Treatment: 0 History: Renal Disease, Neuropathy Clustered Wound: No Wound Measurements Length: (cm) 1 Width: (cm) 0.8 Depth: (cm) 0.1 Area: (cm) 0.628 Volume: (cm) 0.063 % Reduction in Area: % Reduction in Volume: Epithelialization: None Tunneling: No Undermining: No  Wound Description Classification: Grade 1 Wound Margin: Flat and Intact Exudate Amount: Large Exudate Type: Serous Exudate Color: amber Foul Odor After Cleansing: No Slough/Fibrino Yes Wound Bed Granulation Amount: Small (1-33%) Exposed Structure Granulation Quality: Red Fascia Exposed: No Necrotic Amount: Large (67-100%) Fat Layer (Subcutaneous Tissue) Exposed: Yes Necrotic Quality: Eschar, Adherent Slough Tendon Exposed: No Muscle Exposed: No Joint Exposed: No Bone Exposed: No Periwound Skin Texture Texture Color No Abnormalities Noted: No No Abnormalities Noted: No Callus: No Atrophie Blanche: No Crepitus: No Cyanosis: No Excoriation: No Ecchymosis: Yes Induration: No Erythema: No Rash: No Hemosiderin Staining: No Scarring: No Mottled: No Pallor: No Moisture Rubor: No No Abnormalities Noted: No Dry / Scaly: No Temperature / Pain Maceration: No Temperature: No Abnormality Kelsey Allen, Kelsey M. ([id number]) Wound Preparation Ulcer Cleansing: Rinsed/Irrigated with Saline Topical Anesthetic Applied: Other: lidocaine 4%, Treatment Notes Wound #1 (Left, Anterior Lower Leg) 1. Cleansed with: Clean wound with Normal Saline 2. Anesthetic Topical Lidocaine 4% cream to wound bed prior to debridement 3. Peri-wound Care: Skin Prep 4. Dressing Applied: Santyl Ointment 5. Secondary Dressing Applied Bordered Foam Dressing Dry Gauze Electronic Signature(s) Signed: 04/14/2018 5:06:40 PM By: Curtis Sitesorthy, Joanna Entered By: Curtis Sitesorthy, Joanna on 04/14/2018  13:25:03 Kelsey Allen, Kelsey M. ([id number]) -------------------------------------------------------------------------------- Vitals Details Patient Name: Kelsey Allen, Kelsey M. Date of Service: 04/14/2018 1:00 PM Medical Record Number: [id number] Patient Account Number: [id number] Date of Birth/Sex: 04/10/1971 (47 y.o. F) Treating RN: Renne CriglerFlinchum, Cheryl Primary Care Hanna Ra: Marisue IvanLinthavong, Kanhka Other Clinician: Referring Martika Egler: Marisue IvanLinthavong, Kanhka Treating Zaiyah Sottile/Extender: Linwood DibblesSTONE III, HOYT Weeks in Treatment: 0 Vital Signs Time Taken: 13:00 Temperature (F): 98.4 Height (in): 59 Pulse (bpm): 111 Source: Stated Respiratory Rate (breaths/min): 18 Weight (lbs): 82 Blood Pressure (mmHg): 154/95 Source: Stated Reference Range: 80 - 120 mg / dl Body Mass Index (BMI): 16.6 Electronic Signature(s) Signed: 04/14/2018 4:20:46 PM By: Dayton MartesWallace, RCP,RRT,CHT, Sallie RCP, RRT, CHT Entered By: Weyman RodneyWallace, RCP,RRT,CHT, Lucio EdwardSallie on 04/14/2018 13:06:07

## 2018-04-19 NOTE — Progress Notes (Signed)
FAELYN, SIGLER ([id number]) Visit Report for 04/14/2018 Biopsy Details Patient Name: Kelsey Allen, Kelsey Allen. Date of Service: 04/14/2018 1:00 PM Medical Record Number: [id number] Patient Account Number: [id number] Date of Birth/Sex: 08-21-1970 (47 y.o. F) Treating RN: Renne Crigler Primary Care Provider: Marisue Ivan Other Clinician: Referring Provider: Marisue Ivan Treating Provider/Extender: Linwood Dibbles, Chris Narasimhan Weeks in Treatment: 0 Biopsy Performed for: Wound #1 Left, Anterior Lower Leg Location(s): Wound Bed, Peri-Ulcer Tissue Performed By: Physician STONE III, Jad Johansson E., PA-C Tissue Punch: No Number of Specimens Taken: 1 Specimen Sent To Pathology: Yes Pre-procedure Verification/Time-Out Taken: Yes - 14:17 Pain Control: Lidocaine Injectable Lidocaine Percent: 2% Instrument: Blade, Forceps Hemostasis Achieved: Pressure Procedural Pain: 0 Post Procedural Pain: 0 Response to Treatment: Procedure was tolerated well Level of Consciousness: Awake and Alert Post Procedure Diagnosis Same as Pre-procedure Electronic Signature(s) Signed: 04/15/2018 8:12:43 AM By: Renne Crigler Signed: 04/16/2018 2:09:17 AM By: Lenda Kelp PA-C Entered By: Renne Crigler on 04/14/2018 14:20:25 Kelsey Allen ([id number]) -------------------------------------------------------------------------------- Chief Complaint Document Details Patient Name: Kelsey Allen. Date of Service: 04/14/2018 1:00 PM Medical Record Number: [id number] Patient Account Number: [id number] Date of Birth/Sex: 12/23/1970 (47 y.o. F) Treating RN: Renne Crigler Primary Care Provider: Marisue Ivan Other Clinician: Referring Provider: Marisue Ivan Treating Provider/Extender: Linwood Dibbles, Kamorie Aldous Weeks in Treatment: 0 Information Obtained from: Patient Chief Complaint Left lower leg ulcer Electronic Signature(s) Signed: 04/16/2018 2:09:17 AM By: Lenda Kelp PA-C Entered By: Lenda Kelp on 04/16/2018  00:53:19 Kelsey Allen ([id number]) -------------------------------------------------------------------------------- HPI Details Patient Name: Kelsey Allen. Date of Service: 04/14/2018 1:00 PM Medical Record Number: [id number] Patient Account Number: [id number] Date of Birth/Sex: Dec 09, 1970 (47 y.o. F) Treating RN: Renne Crigler Primary Care Provider: Marisue Ivan Other Clinician: Referring Provider: Marisue Ivan Treating Provider/Extender: Linwood Dibbles, Yannely Kintzel Weeks in Treatment: 0 History of Present Illness Associated Signs and Symptoms: Patient has a history of diabetes mellitus type II, status post heart transplant, and is status post kidney transplant. HPI Description: 04/14/18 on evaluation today patient actually presents for initial evaluation regarding a wound on the left anterior lower extremity. This is something that she states she noted after having a fall at the beginning of May 2019. Subsequently though it was not until somewhere around May 30 that she actually notice the wound. She does not state that there was any particular hematoma that came up following her fall in fact she's not even sure that she had a the injury to this area. She does have diabetes and her hemoglobin A1c December 2018 with 7.7. This has I believe been checked since that point but we do not have access to those records. The patient does have hearing loss and wears hearing aids. She did have a stent placed 1-2 years ago in her heart again is status post heart transplant. At this point the overall appearance of the wound so to speak is that of having what appears to be a fairly firmly attached but softened eschar on the surface of the wound although the pit appearance in general is a little bit unusual. Especially with the very vague history of when this began I am at least somewhat concerned about the possibility of this being something other than just your typical injury/wound. Potentially  this may be something more pathologic potentially even a skin cancer. Nonetheless as I explained to the patient and her mother who were present during the office visit today I do not want her to be overly worried about it but I do  think that it would be appropriate for [place] to consider biopsy of the area to send for evaluation. Obviously if it turns out that there's nothing significant going on here that is excellent news. Otherwise if there is something that needs to be addressed by dermatology we can always make the referral at that point. No fevers, chills, nausea, or vomiting noted at this time. 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 Electronic Signature(s) Signed: 04/16/2018 2:09:17 AM By: Lenda Kelp PA-C Entered By: Lenda Kelp on 04/16/2018 01:43:19 Kelsey Allen ([id number]) -------------------------------------------------------------------------------- Physical Exam Details Patient Name: Kelsey Allen. Date of Service: 04/14/2018 1:00 PM Medical Record Number: [id number] Patient Account Number: [id number] Date of Birth/Sex: May 12, 1971 (47 y.o. F) Treating RN: Renne Crigler Primary Care Provider: Marisue Ivan Other Clinician: Referring Provider: Marisue Ivan Treating Provider/Extender: Linwood Dibbles, Dewie Ahart Weeks in Treatment: 0 Constitutional patient is hypertensive.. pulse regular and within target range for patient.Marland Kitchen respirations regular, non-labored and within target range for patient.Marland Kitchen temperature within target range for patient.. Well-nourished and well-hydrated in no acute distress. Eyes conjunctiva clear no eyelid edema noted. pupils equal round and reactive to light and accommodation. Ears, Nose, Mouth, and Throat no gross abnormality of ear auricles or external auditory canals. patient wears hearing aids. mucus membranes moist. Respiratory normal breathing without difficulty. clear to auscultation  bilaterally. Cardiovascular regular rate and rhythm with normal S1, S2. 2+ dorsalis pedis/posterior tibialis pulses. no clubbing, cyanosis, significant edema, <3 sec cap refill. Gastrointestinal (GI) soft, non-tender, non-distended, +BS. no ventral hernia noted. Musculoskeletal normal gait and posture. no significant deformity or arthritic changes, no loss or range of motion, no clubbing. Psychiatric this patient is able to make decisions and demonstrates good insight into disease process. Alert and Oriented x 3. pleasant and cooperative. Notes On inspection patient's wound actually had a very unusual appearance at this point the did make me concerned enough that I did feel like a biopsy would be appropriate. Therefore I did actually obtain a biopsy of this lesion which was sent for evaluation. Patient tolerated this without complications once I had the area numb utilizing 2% lidocaine. Electronic Signature(s) Signed: 04/16/2018 2:09:17 AM By: Lenda Kelp PA-C Entered By: Lenda Kelp on 04/16/2018 01:44:54 Kelsey Allen ([id number]) -------------------------------------------------------------------------------- Physician Orders Details Patient Name: Kelsey Allen. Date of Service: 04/14/2018 1:00 PM Medical Record Number: [id number] Patient Account Number: [id number] Date of Birth/Sex: [birth year] (48 y.o. F) Treating RN: Renne Crigler Primary Care Provider: Marisue Ivan Other Clinician: Referring Provider: Marisue Ivan Treating Provider/Extender: Linwood Dibbles, Younique Casad Weeks in Treatment: 0 Verbal / Phone Orders: No Diagnosis Coding ICD-10 Coding Code Description E11.622 Type 2 diabetes mellitus with other skin ulcer L97.822 Non-pressure chronic ulcer of other part of left lower leg with fat layer exposed Z94.1 Heart transplant status Z94.0 Kidney transplant status Wound Cleansing Wound #1 Left,Anterior Lower Leg o Clean wound with Normal Saline. Anesthetic  (add to Medication List) Wound #1 Left,Anterior Lower Leg o Topical Lidocaine 4% cream applied to wound bed prior to debridement (In Clinic Only). Primary Wound Dressing Wound #1 Left,Anterior Lower Leg o Santyl Ointment Secondary Dressing Wound #1 Left,Anterior Lower Leg o Dry Gauze - saline wet gauze over santyl then apply dry gauze o Boardered Foam Dressing Dressing Change Frequency Wound #1 Left,Anterior Lower Leg o Change dressing every day. Follow-up Appointments Wound #1 Left,Anterior Lower Leg o Return Appointment in 1 week. Electronic Signature(s) Signed: 04/15/2018 8:12:43 AM By: Renne Crigler Signed:  04/16/2018 2:09:17 AM By: Lenda Kelp PA-C Entered By: Renne Crigler on 04/14/2018 14:22:54 Kelsey Allen ([id number]) -------------------------------------------------------------------------------- Problem List Details Patient Name: Kelsey Allen. Date of Service: 04/14/2018 1:00 PM Medical Record Number: [id number] Patient Account Number: [id number] Date of Birth/Sex: 06-18-1971 (47 y.o. F) Treating RN: Renne Crigler Primary Care Provider: Marisue Ivan Other Clinician: Referring Provider: Marisue Ivan Treating Provider/Extender: Linwood Dibbles, Atarah Cadogan Weeks in Treatment: 0 Active Problems ICD-10 Evaluated Encounter Code Description Active Date Today Diagnosis E11.622 Type 2 diabetes mellitus with other skin ulcer 04/14/2018 No Yes L97.822 Non-pressure chronic ulcer of other part of left lower leg with 04/14/2018 No Yes fat layer exposed Z94.1 Heart transplant status 04/14/2018 No Yes Z94.0 Kidney transplant status 04/14/2018 No Yes Inactive Problems Resolved Problems Electronic Signature(s) Signed: 04/16/2018 2:09:17 AM By: Lenda Kelp PA-C Entered By: Lenda Kelp on 04/14/2018 14:03:52 Reppond, Noam M. ([id number]) -------------------------------------------------------------------------------- Progress Note Details Patient  Name: Kelsey Allen. Date of Service: 04/14/2018 1:00 PM Medical Record Number: [id number] Patient Account Number: [id number] Date of Birth/Sex: 1971/04/14 (47 y.o. F) Treating RN: Renne Crigler Primary Care Provider: Marisue Ivan Other Clinician: Referring Provider: Marisue Ivan Treating Provider/Extender: Linwood Dibbles, Gwenn Teodoro Weeks in Treatment: 0 Subjective Chief Complaint Information obtained from Patient Left lower leg ulcer History of Present Illness (HPI) The following HPI elements were documented for the patient's wound: Associated Signs and Symptoms: Patient has a history of diabetes mellitus type II, status post heart transplant, and is status post kidney transplant. 04/14/18 on evaluation today patient actually presents for initial evaluation regarding a wound on the left anterior lower extremity. This is something that she states she noted after having a fall at the beginning of May 2019. Subsequently though it was not until somewhere around May 30 that she actually notice the wound. She does not state that there was any particular hematoma that came up following her fall in fact she's not even sure that she had a the injury to this area. She does have diabetes and her hemoglobin A1c December 2018 with 7.7. This has I believe been checked since that point but we do not have access to those records. The patient does have hearing loss and wears hearing aids. She did have a stent placed 1-2 years ago in her heart again is status post heart transplant. At this point the overall appearance of the wound so to speak is that of having what appears to be a fairly firmly attached but softened eschar on the surface of the wound although the pit appearance in general is a little bit unusual. Especially with the very vague history of when this began I am at least somewhat concerned about the possibility of this being something other than just your typical injury/wound. Potentially  this may be something more pathologic potentially even a skin cancer. Nonetheless as I explained to the patient and her mother who were present during the office visit today I do not want her to be overly worried about it but I do think that it would be appropriate for [place] to consider biopsy of the area to send for evaluation. Obviously if it turns out that there's nothing significant going on here that is excellent news. Otherwise if there is something that needs to be addressed by dermatology we can always make the referral at that point. No fevers, chills, nausea, or vomiting noted at this time. 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 Wound History Patient presents  with 1 open wound that has been present for approximately 5 months. Patient has been treating wound in the following manner: santyl. Laboratory tests have not been performed in the last month. Patient reportedly has not tested positive for an antibiotic resistant organism. Patient reportedly has not tested positive for osteomyelitis. Patient reportedly has not had testing performed to evaluate circulation in the legs. Patient experiences the following problems associated with their wounds: swelling. Patient History Information obtained from Patient. Allergies lisinopril, penicillin, Zithromax Social History Never smoker, Marital Status - Divorced, Alcohol Use - Never, Drug Use - No History, Caffeine Use - Daily. Medical History Eyes SHEBRA, MULDROW ([id number]) Denies history of Cataracts, Glaucoma, Optic Neuritis Ear/Nose/Mouth/Throat Denies history of Chronic sinus problems/congestion, Middle ear problems Hematologic/Lymphatic Denies history of Anemia, Hemophilia, Human Immunodeficiency Virus, Lymphedema, Sickle Cell Disease Respiratory Denies history of Aspiration, Asthma, Chronic Obstructive Pulmonary Disease (COPD), Pneumothorax, Sleep Apnea, Tuberculosis Cardiovascular Patient has  history of Hypertension Denies history of Angina, Arrhythmia, Congestive Heart Failure, Coronary Artery Disease, Deep Vein Thrombosis, Hypotension, Myocardial Infarction, Peripheral Arterial Disease, Peripheral Venous Disease, Phlebitis, Vasculitis Gastrointestinal Denies history of Cirrhosis , Colitis, Crohn s, Hepatitis A, Hepatitis B, Hepatitis C Endocrine Patient has history of Type II Diabetes Denies history of Type I Diabetes Genitourinary Patient has history of End Stage Renal Disease - chronic renal insufficiency Immunological Denies history of Lupus Erythematosus, Raynaud s, Scleroderma Integumentary (Skin) Denies history of History of Burn, History of pressure wounds Musculoskeletal Denies history of Gout, Rheumatoid Arthritis, Osteoarthritis, Osteomyelitis Neurologic Patient has history of Neuropathy Denies history of Dementia, Quadriplegia, Paraplegia, Seizure Disorder Oncologic Denies history of Received Chemotherapy, Received Radiation Psychiatric Denies history of Anorexia/bulimia, Confinement Anxiety Medical And Surgical History Notes Ear/Nose/Mouth/Throat hearing deficit - hearing aids in both ears Cardiovascular heart transplant 2009 Genitourinary kidney transplant 2009 Integumentary (Skin) psoriasis Musculoskeletal avascular necrosis of the hip Review of Systems (ROS) Constitutional Symptoms (General Health) Denies complaints or symptoms of Fatigue, Fever, Chills, Marked Weight Change. Eyes Denies complaints or symptoms of Dry Eyes, Vision Changes, Glasses / Contacts. Ear/Nose/Mouth/Throat Denies complaints or symptoms of Difficult clearing ears, Sinusitis. Hematologic/Lymphatic Denies complaints or symptoms of Bleeding / Clotting Disorders, Human Immunodeficiency Virus. Respiratory Denies complaints or symptoms of Chronic or frequent coughs, Shortness of Breath. Cardiovascular Complains or has symptoms of LE edema. Denies complaints or symptoms of  Chest pain. Gastrointestinal Nowakowski, Cathye M. ([id number]) Denies complaints or symptoms of Frequent diarrhea, Nausea, Vomiting. Endocrine Denies complaints or symptoms of Hepatitis, Thyroid disease, Polydypsia (Excessive Thirst). Genitourinary Complains or has symptoms of Kidney failure/ Dialysis - chronic renal insufficiency. Denies complaints or symptoms of Incontinence/dribbling. Immunological Denies complaints or symptoms of Hives, Itching. Integumentary (Skin) Complains or has symptoms of Wounds. Denies complaints or symptoms of Bleeding or bruising tendency, Breakdown, Swelling. Musculoskeletal Denies complaints or symptoms of Muscle Pain, Muscle Weakness. Neurologic Denies complaints or symptoms of Numbness/parasthesias, Focal/Weakness. Psychiatric Denies complaints or symptoms of Anxiety, Claustrophobia. Objective Constitutional patient is hypertensive.. pulse regular and within target range for patient.Marland Kitchen respirations regular, non-labored and within target range for patient.Marland Kitchen temperature within target range for patient.. Well-nourished and well-hydrated in no acute distress. Vitals Time Taken: 1:00 PM, Height: 59 in, Source: Stated, Weight: 82 lbs, Source: Stated, BMI: 16.6, Temperature: 98.4 F, Pulse: 111 bpm, Respiratory Rate: 18 breaths/min, Blood Pressure: 154/95 mmHg. Eyes conjunctiva clear no eyelid edema noted. pupils equal round and reactive to light and accommodation. Ears, Nose, Mouth, and Throat no gross abnormality of ear auricles or external auditory canals.  patient wears hearing aids. mucus membranes moist. Respiratory normal breathing without difficulty. clear to auscultation bilaterally. Cardiovascular regular rate and rhythm with normal S1, S2. 2+ dorsalis pedis/posterior tibialis pulses. no clubbing, cyanosis, significant edema, Gastrointestinal (GI) soft, non-tender, non-distended, +BS. no ventral hernia noted. Musculoskeletal normal gait and  posture. no significant deformity or arthritic changes, no loss or range of motion, no clubbing. Psychiatric this patient is able to make decisions and demonstrates good insight into disease process. Alert and Oriented x 3. pleasant and cooperative. LOVENIA, DEBRULER ([id number]) General Notes: On inspection patient's wound actually had a very unusual appearance at this point the did make me concerned enough that I did feel like a biopsy would be appropriate. Therefore I did actually obtain a biopsy of this lesion which was sent for evaluation. Patient tolerated this without complications once I had the area numb utilizing 2% lidocaine. Integumentary (Hair, Skin) Wound #1 status is Open. Original cause of wound was Trauma. The wound is located on the Left,Anterior Lower Leg. The wound measures 1cm length x 0.8cm width x 0.1cm depth; 0.628cm^2 area and 0.063cm^3 volume. There is Fat Layer (Subcutaneous Tissue) Exposed exposed. There is no tunneling or undermining noted. There is a large amount of serous drainage noted. The wound margin is flat and intact. There is small (1-33%) red granulation within the wound bed. There is a large (67-100%) amount of necrotic tissue within the wound bed including Eschar and Adherent Slough. The periwound skin appearance exhibited: Ecchymosis. The periwound skin appearance did not exhibit: Callus, Crepitus, Excoriation, Induration, Rash, Scarring, Dry/Scaly, Maceration, Atrophie Blanche, Cyanosis, Hemosiderin Staining, Mottled, Pallor, Rubor, Erythema. Periwound temperature was noted as No Abnormality. Assessment Active Problems ICD-10 Type 2 diabetes mellitus with other skin ulcer Non-pressure chronic ulcer of other part of left lower leg with fat layer exposed Heart transplant status Kidney transplant status Procedures Wound #1 Pre-procedure diagnosis of Wound #1 is a Diabetic Wound/Ulcer of the Lower Extremity located on the Left, Anterior Lower Leg .  There was a biopsy performed by STONE III, Jaquasia Doscher E., PA-C. There was a biopsy performed on Wound Bed, Peri-Ulcer Tissue. The skin was cleansed and prepped with anti-septic followed by pain control using Lidocaine Injectable: 2%. Tissue was removed at its base with the following instrument(s): Blade and Forceps and sent to pathology. A time out was conducted at 14:17, prior to the start of the procedure. The procedure was tolerated well with a pain level of 0 throughout and a pain level of 0 following the procedure. Patient s Level of Consciousness post procedure was recorded as Awake and Alert. Post procedure Diagnosis Wound #1: Same as Pre-Procedure Plan Wound Cleansing: Wound #1 Left,Anterior Lower Leg: Clean wound with Normal Saline. Anesthetic (add to Medication List): Wound #1 Left,Anterior Lower Leg: Topical Lidocaine 4% cream applied to wound bed prior to debridement (In Clinic Only). Primary Wound Dressing: Wound #1 Left,Anterior Lower Leg: SHALISSA, [place]. ([id number]) Santyl Ointment Secondary Dressing: Wound #1 Left,Anterior Lower Leg: Dry Gauze - saline wet gauze over santyl then apply dry gauze Boardered Foam Dressing Dressing Change Frequency: Wound #1 Left,Anterior Lower Leg: Change dressing every day. Follow-up Appointments: Wound #1 Left,Anterior Lower Leg: Return Appointment in 1 week. At this point my suggestion for the patient was that we initiate treatment per above with Santyl. She's already been using this anyway which I think is completely appropriate. We will subsequently see were things stand at follow-up. Please see above for specific wound care orders. We will see  patient for re-evaluation in 1 week(s) here in the clinic. If anything worsens or changes patient will contact our office for additional recommendations. Electronic Signature(s) Signed: 04/16/2018 2:09:17 AM By: Lenda Kelp PA-C Entered By: Lenda Kelp on 04/16/2018 01:45:33 Kelsey Allen ([id number]) -------------------------------------------------------------------------------- ROS/PFSH Details Patient Name: Kelsey Allen. Date of Service: 04/14/2018 1:00 PM Medical Record Number: [id number] Patient Account Number: [id number] Date of Birth/Sex: 12-31-1970 (47 y.o. F) Treating RN: Curtis Sites Primary Care Provider: Marisue Ivan Other Clinician: Referring Provider: Marisue Ivan Treating Provider/Extender: Linwood Dibbles, Vyolet Sakuma Weeks in Treatment: 0 Information Obtained From Patient Wound History Do you currently have one or more open woundso Yes How many open wounds do you currently haveo 1 Approximately how long have you had your woundso 5 months How have you been treating your wound(s) until nowo santyl Has your wound(s) ever healed and then re-openedo No Have you had any lab work done in the past montho No Have you tested positive for an antibiotic resistant organism (MRSA, VRE)o No Have you tested positive for osteomyelitis (bone infection)o No Have you had any tests for circulation on your legso No Have you had other problems associated with your woundso Swelling Constitutional Symptoms (General Health) Complaints and Symptoms: Negative for: Fatigue; Fever; Chills; Marked Weight Change Eyes Complaints and Symptoms: Negative for: Dry Eyes; Vision Changes; Glasses / Contacts Medical History: Negative for: Cataracts; Glaucoma; Optic Neuritis Ear/Nose/Mouth/Throat Complaints and Symptoms: Negative for: Difficult clearing ears; Sinusitis Medical History: Negative for: Chronic sinus problems/congestion; Middle ear problems Past Medical History Notes: hearing deficit - hearing aids in both ears Hematologic/Lymphatic Complaints and Symptoms: Negative for: Bleeding / Clotting Disorders; Human Immunodeficiency Virus Medical History: Negative for: Anemia; Hemophilia; Human Immunodeficiency Virus; Lymphedema; Sickle Cell Disease Respiratory Complaints  and Symptoms: Negative for: Chronic or frequent coughs; Shortness of Breath CERITA, RABELO. ([id number]) Medical History: Negative for: Aspiration; Asthma; Chronic Obstructive Pulmonary Disease (COPD); Pneumothorax; Sleep Apnea; Tuberculosis Cardiovascular Complaints and Symptoms: Positive for: LE edema Negative for: Chest pain Medical History: Positive for: Hypertension Negative for: Angina; Arrhythmia; Congestive Heart Failure; Coronary Artery Disease; Deep Vein Thrombosis; Hypotension; Myocardial Infarction; Peripheral Arterial Disease; Peripheral Venous Disease; Phlebitis; Vasculitis Past Medical History Notes: heart transplant 2009 Gastrointestinal Complaints and Symptoms: Negative for: Frequent diarrhea; Nausea; Vomiting Medical History: Negative for: Cirrhosis ; Colitis; Crohnos; Hepatitis A; Hepatitis B; Hepatitis C Endocrine Complaints and Symptoms: Negative for: Hepatitis; Thyroid disease; Polydypsia (Excessive Thirst) Medical History: Positive for: Type II Diabetes Negative for: Type I Diabetes Genitourinary Complaints and Symptoms: Positive for: Kidney failure/ Dialysis - chronic renal insufficiency Negative for: Incontinence/dribbling Medical History: Positive for: End Stage Renal Disease - chronic renal insufficiency Past Medical History Notes: kidney transplant 2009 Immunological Complaints and Symptoms: Negative for: Hives; Itching Medical History: Negative for: Lupus Erythematosus; Raynaudos; Scleroderma Integumentary (Skin) Complaints and Symptoms: Positive for: Wounds Negative for: Bleeding or bruising tendency; Breakdown; Swelling Newey, Karl M. ([id number]) Medical History: Negative for: History of Burn; History of pressure wounds Past Medical History Notes: psoriasis Musculoskeletal Complaints and Symptoms: Negative for: Muscle Pain; Muscle Weakness Medical History: Negative for: Gout; Rheumatoid Arthritis; Osteoarthritis;  Osteomyelitis Past Medical History Notes: avascular necrosis of the hip Neurologic Complaints and Symptoms: Negative for: Numbness/parasthesias; Focal/Weakness Medical History: Positive for: Neuropathy Negative for: Dementia; Quadriplegia; Paraplegia; Seizure Disorder Psychiatric Complaints and Symptoms: Negative for: Anxiety; Claustrophobia Medical History: Negative for: Anorexia/bulimia; Confinement Anxiety Oncologic Medical History: Negative for: Received Chemotherapy; Received Radiation Immunizations Pneumococcal Vaccine: Received Pneumococcal Vaccination: Yes Implantable Devices  Family and Social History Never smoker; Marital Status - Divorced; Alcohol Use: Never; Drug Use: No History; Caffeine Use: Daily; Financial Concerns: No; Food, Clothing or Shelter Needs: No; Support System Lacking: No; Transportation Concerns: No; Advanced Directives: Yes; Patient does not want information on Advanced Directives; Living Will: Yes Electronic Signature(s) Signed: 04/14/2018 5:06:40 PM By: Curtis Sitesorthy, Joanna Signed: 04/16/2018 2:09:17 AM By: Lenda KelpStone III, Rory Xiang PA-C Entered By: Curtis Sitesorthy, Joanna on 04/14/2018 13:15:26 Below, Brigida Judie PetitM. ([id number]) -------------------------------------------------------------------------------- SuperBill Details Patient Name: Kelsey AntisHESTER, Saranne M. Date of Service: 04/14/2018 Medical Record Number: [id number] Patient Account Number: [id number] Date of Birth/Sex: 02/24/1971 (47 y.o. F) Treating RN: Renne CriglerFlinchum, Cheryl Primary Care Provider: Marisue IvanLinthavong, Kanhka Other Clinician: Referring Provider: Marisue IvanLinthavong, Kanhka Treating Provider/Extender: Linwood DibblesSTONE III, Aviana Shevlin Weeks in Treatment: 0 Diagnosis Coding ICD-10 Codes Code Description E11.622 Type 2 diabetes mellitus with other skin ulcer L97.822 Non-pressure chronic ulcer of other part of left lower leg with fat layer exposed Z94.1 Heart transplant status Z94.0 Kidney transplant status Facility Procedures CPT4 Code:  [id number] Description: [id number] - WOUND CARE VISIT-LEV 2 EST PT Modifier: Quantity: 1 Physician Procedures CPT4 Code Description: [id number] - WC PHYS LEVEL 4 - NEW PT ICD-10 Diagnosis Description E11.622 Type 2 diabetes mellitus with other skin ulcer L97.822 Non-pressure chronic ulcer of other part of left lower leg with Z94.1 Heart transplant status  Z94.0 Kidney transplant status Modifier: fat layer expos Quantity: 1 ed Electronic Signature(s) Signed: 04/16/2018 2:09:17 AM By: Lenda KelpStone III, Etana Beets PA-C Previous Signature: 04/15/2018 8:12:43 AM Version By: Renne CriglerFlinchum, Cheryl Entered By: Lenda KelpStone III, Carleen Rhue on 04/16/2018 01:45:55

## 2018-04-19 NOTE — Progress Notes (Signed)
Kelsey Allen ([id number]) Visit Report for 04/14/2018 Abuse/Suicide Risk Screen Details Patient Name: Kelsey Allen, Kelsey Allen. Date of Service: 04/14/2018 1:00 PM Medical Record Number: [id number] Patient Account Number: [id number] Date of Birth/Sex: [birth year] (47 y.o. F) Treating RN: Curtis Sites Primary Care Abdulmalik Darco: Marisue Ivan Other Clinician: Referring Zhara Gieske: Marisue Ivan Treating Avi Kerschner/Extender: Linwood Dibbles, HOYT Weeks in Treatment: 0 Abuse/Suicide Risk Screen Items Answer ABUSE/SUICIDE RISK SCREEN: Has anyone close to you tried to hurt or harm you recentlyo No Do you feel uncomfortable with anyone in your familyo No Has anyone forced you do things that you didnot want to doo No Do you have any thoughts of harming yourselfo No Patient displays signs or symptoms of abuse and/or neglect. No Electronic Signature(s) Signed: 04/14/2018 12:54:50 PM By: Curtis Sites Entered By: Curtis Sites on 04/14/2018 12:54:50 Wyche, Altamease Oiler ([id number]) -------------------------------------------------------------------------------- Activities of Daily Living Details Patient Name: Kelsey Allen. Date of Service: 04/14/2018 1:00 PM Medical Record Number: [id number] Patient Account Number: [id number] Date of Birth/Sex: [birth year] (47 y.o. F) Treating RN: Curtis Sites Primary Care Ediel Unangst: Marisue Ivan Other Clinician: Referring Mylan Lengyel: Marisue Ivan Treating Veniamin Kincaid/Extender: Linwood Dibbles, HOYT Weeks in Treatment: 0 Activities of Daily Living Items Answer Activities of Daily Living (Please select one for each item) Drive Automobile Completely Able Take Medications Completely Able Use Telephone Completely Able Care for Appearance Completely Able Use Toilet Completely Able Bath / Shower Completely Able Dress Self Completely Able Feed Self Completely Able Walk Completely Able Get In / Out Bed Completely Able Housework Completely Able Prepare Meals Completely  Able Handle Money Completely Able Shop for Self Completely Able Electronic Signature(s) Signed: 04/14/2018 5:06:40 PM By: Curtis Sites Entered By: Curtis Sites on 04/14/2018 13:15:52 Raybourn, Altamease Oiler ([id number]) -------------------------------------------------------------------------------- Education Assessment Details Patient Name: Kelsey Allen. Date of Service: 04/14/2018 1:00 PM Medical Record Number: [id number] Patient Account Number: [id number] Date of Birth/Sex: 1970-10-21 (47 y.o. F) Treating RN: Curtis Sites Primary Care Miarose Lippert: Marisue Ivan Other Clinician: Referring Jolan Upchurch: Marisue Ivan Treating Damia Bobrowski/Extender: Linwood Dibbles, HOYT Weeks in Treatment: 0 Primary Learner Assessed: Patient Learning Preferences/Education Level/Primary Language Learning Preference: Explanation, Demonstration Highest Education Level: College or Above Preferred Language: English Cognitive Barrier Assessment/Beliefs Language Barrier: No Translator Needed: No Memory Deficit: No Emotional Barrier: No Cultural/Religious Beliefs Affecting Medical Care: No Physical Barrier Assessment Impaired Vision: No Impaired Hearing: No Decreased Hand dexterity: No Knowledge/Comprehension Assessment Knowledge Level: Medium Comprehension Level: Medium Ability to understand written Medium instructions: Ability to understand verbal Medium instructions: Motivation Assessment Anxiety Level: Calm Cooperation: Cooperative Education Importance: Acknowledges Need Interest in Health Problems: Asks Questions Perception: Coherent Willingness to Engage in Self- Medium Management Activities: Readiness to Engage in Self- Medium Management Activities: Electronic Signature(s) Signed: 04/14/2018 5:06:40 PM By: Curtis Sites Entered By: Curtis Sites on 04/14/2018 13:16:20 Kelsey Allen ([id number]) -------------------------------------------------------------------------------- Fall  Risk Assessment Details Patient Name: Kelsey Allen. Date of Service: 04/14/2018 1:00 PM Medical Record Number: [id number] Patient Account Number: [id number] Date of Birth/Sex: May 19, 1971 (47 y.o. F) Treating RN: Curtis Sites Primary Care Hutch Rhett: Marisue Ivan Other Clinician: Referring Heyli Min: Marisue Ivan Treating Afifa Truax/Extender: Linwood Dibbles, HOYT Weeks in Treatment: 0 Fall Risk Assessment Items Have you had 2 or more falls in the last 12 monthso 0 No Have you had any fall that resulted in injury in the last 12 monthso 0 No FALL RISK ASSESSMENT: History of falling - immediate or within 3 months 0 No Secondary diagnosis 0 No Ambulatory aid None/bed rest/wheelchair/nurse 0 Yes Crutches/cane/walker 0 No Furniture  0 No IV Access/Saline Lock 0 No Gait/Training Normal/bed rest/immobile 0 Yes Weak 0 No Impaired 0 No Mental Status Oriented to own ability 0 Yes Electronic Signature(s) Signed: 04/14/2018 5:06:40 PM By: Curtis Sitesorthy, Joanna Entered By: Curtis Sitesorthy, Joanna on 04/14/2018 13:16:40 Mitchner, Guadalupe M. ([id number]) -------------------------------------------------------------------------------- Foot Assessment Details Patient Name: Kelsey AntisHESTER, Kelsey M. Date of Service: 04/14/2018 1:00 PM Medical Record Number: [id number] Patient Account Number: [id number] Date of Birth/Sex: 06/05/1971 (47 y.o. F) Treating RN: Curtis Sitesorthy, Joanna Primary Care Kyrielle Urbanski: Marisue IvanLinthavong, Kanhka Other Clinician: Referring Isidro Monks: Marisue IvanLinthavong, Kanhka Treating Mckinley Adelstein/Extender: Linwood DibblesSTONE III, HOYT Weeks in Treatment: 0 Foot Assessment Items Site Locations + = Sensation present, - = Sensation absent, C = Callus, U = Ulcer R = Redness, W = Warmth, M = Maceration, PU = Pre-ulcerative lesion F = Fissure, S = Swelling, D = Dryness Assessment Right: Left: Other Deformity: No No Prior Foot Ulcer: No No Prior Amputation: No No Charcot Joint: No No Ambulatory Status: Ambulatory Without Help Gait:  Steady Electronic Signature(s) Signed: 04/14/2018 5:06:40 PM By: Curtis Sitesorthy, Joanna Entered By: Curtis Sitesorthy, Joanna on 04/14/2018 13:22:20 Dim[place], Altamease OilerERICA M. ([id number]) -------------------------------------------------------------------------------- Nutrition Risk Assessment Details Patient Name: Kelsey AntisHESTER, Meghann M. Date of Service: 04/14/2018 1:00 PM Medical Record Number: [id number] Patient Account Number: [id number] Date of Birth/Sex: 06/28/1971 (47 y.o. F) Treating RN: Curtis Sitesorthy, Joanna Primary Care Sarahlynn Cisnero: Marisue IvanLinthavong, Kanhka Other Clinician: Referring Elizabth Palka: Marisue IvanLinthavong, Kanhka Treating Reeder Brisby/Extender: Linwood DibblesSTONE III, HOYT Weeks in Treatment: 0 Height (in): Weight (lbs): Body Mass Index (BMI): Nutrition Risk Assessment Items NUTRITION RISK SCREEN: I have an illness or condition that made me change the kind and/or amount of 0 No food I eat I eat fewer than two meals per day 0 No I eat few fruits and vegetables, or milk products 0 No I have three or more drinks of beer, liquor or wine almost every day 0 No I have tooth or mouth problems that make it hard for me to eat 0 No I don't always have enough money to buy the food I need 0 No I eat alone most of the time 0 No I take three or more different prescribed or over-the-counter drugs a day 1 Yes Without wanting to, I have lost or gained 10 pounds in the last six months 0 No I am not always physically able to shop, cook and/or feed myself 0 No Nutrition Protocols Good Risk Protocol 0 No interventions needed Moderate Risk Protocol Electronic Signature(s) Signed: 04/14/2018 12:55:02 PM By: Curtis Sitesorthy, Joanna Entered By: Curtis Sitesorthy, Joanna on 04/14/2018 12:55:02

## 2018-04-21 ENCOUNTER — Encounter: Payer: 59 | Admitting: Physician Assistant

## 2018-04-21 DIAGNOSIS — E11621 Type 2 diabetes mellitus with foot ulcer: Secondary | ICD-10-CM | POA: Diagnosis not present

## 2018-04-23 NOTE — Progress Notes (Signed)
Kelsey Allen ([id number]) Visit Report for 04/21/2018 Chief Complaint Document Details Patient Name: Kelsey Allen. Date of Service: 04/21/2018 2:30 PM Medical Record Number: [id number] Patient Account Number: [id number] Date of Birth/Sex: 14-Apr-1971 (47 y.o. F) Treating RN: Huel Coventry Primary Care Provider: Marisue Ivan Other Clinician: Referring Provider: Marisue Ivan Treating Provider/Extender: Linwood Dibbles,  Weeks in Treatment: 1 Information Obtained from: Patient Chief Complaint Left lower leg ulcer Electronic Signature(s) Signed: 04/21/2018 4:52:54 PM By: Lenda Kelp PA-C Entered By: Lenda Kelp on 04/21/2018 14:29:17 Kelsey Allen ([id number]) -------------------------------------------------------------------------------- HPI Details Patient Name: Kelsey Allen. Date of Service: 04/21/2018 2:30 PM Medical Record Number: [id number] Patient Account Number: [id number] Date of Birth/Sex: Mar 19, 1971 (47 y.o. F) Treating RN: Huel Coventry Primary Care Provider: Marisue Ivan Other Clinician: Referring Provider: Marisue Ivan Treating Provider/Extender: Linwood Dibbles,  Weeks in Treatment: 1 History of Present Illness Associated Signs and Symptoms: Patient has a history of diabetes mellitus type II, status post heart transplant, and is status post kidney transplant. HPI Description: 04/14/18 on evaluation today patient actually presents for initial evaluation regarding a wound on the left anterior lower extremity. This is something that she states she noted after having a fall at the beginning of May 2019. Subsequently though it was not until somewhere around May 30 that she actually notice the wound. She does not state that there was any particular hematoma that came up following her fall in fact she's not even sure that she had a the injury to this area. She does have diabetes and her hemoglobin A1c December 2018 with 7.7. This has I believe been  checked since that point but we do not have access to those records. The patient does have hearing loss and wears hearing aids. She did have a stent placed 1-2 years ago in her heart again is status post heart transplant. At this point the overall appearance of the wound so to speak is that of having what appears to be a fairly firmly attached but softened eschar on the surface of the wound although the pit appearance in general is a little bit unusual. Especially with the very vague history of when this began I am at least somewhat concerned about the possibility of this being something other than just your typical injury/wound. Potentially this may be something more pathologic potentially even a skin cancer. Nonetheless as I explained to the patient and her mother who were present during the office visit today I do not want her to be overly worried about it but I do think that it would be appropriate for [place] to consider biopsy of the area to send for evaluation. Obviously if it turns out that there's nothing significant going on here that is excellent news. Otherwise if there is something that needs to be addressed by dermatology we can always make the referral at that point. No fevers, chills, nausea, or vomiting noted at this time. 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 04/21/18 on evaluation today patient's wound actually appears to be doing excellent on evaluation today. The good news is her biopsy also came back negative showing just necrotic tissue and granulation tissue. This is also excellent news. Overall I'm very pleased with how things appear at this time. Fortunately there does not appear to be any evidence of infection which is also great news. Overall I feel like she's making wonderful progress. Electronic Signature(s) Signed: 04/21/2018 4:52:54 PM By: Lenda Kelp PA-C Entered By: Larina Bras  III, Leonard Schwartz on 04/21/2018 14:42:06 Kelsey Allen  ([id number]) -------------------------------------------------------------------------------- Physical Exam Details Patient Name: Kelsey Allen. Date of Service: 04/21/2018 2:30 PM Medical Record Number: [id number] Patient Account Number: [id number] Date of Birth/Sex: 11-21-1970 (47 y.o. F) Treating RN: Huel Coventry Primary Care Provider: Marisue Ivan Other Clinician: Referring Provider: Marisue Ivan Treating Provider/Extender: Linwood Dibbles,  Weeks in Treatment: 1 Constitutional Well-nourished and well-hydrated in no acute distress. Respiratory normal breathing without difficulty. clear to auscultation bilaterally. Cardiovascular regular rate and rhythm with normal S1, S2. Psychiatric this patient is able to make decisions and demonstrates good insight into disease process. Alert and Oriented x 3. pleasant and cooperative. Notes Currently patient's wound did not require any sharp debridement it actually appears to have an excellent granulation bed. Overall very pleased in this regard. If anything we may have to watch out for hyper granulation but even that I think may be unlikely she has great epithelialization noted at this point. Electronic Signature(s) Signed: 04/21/2018 4:52:54 PM By: Lenda Kelp PA-C Entered By: Lenda Kelp on 04/21/2018 14:42:53 Kelsey Allen ([id number]) -------------------------------------------------------------------------------- Physician Orders Details Patient Name: Kelsey Allen. Date of Service: 04/21/2018 2:30 PM Medical Record Number: [id number] Patient Account Number: [id number] Date of Birth/Sex: 1970/10/11 (47 y.o. F) Treating RN: Huel Coventry Primary Care Provider: Marisue Ivan Other Clinician: Referring Provider: Marisue Ivan Treating Provider/Extender: Linwood Dibbles,  Weeks in Treatment: 1 Verbal / Phone Orders: No Diagnosis Coding ICD-10 Coding Code Description E11.622 Type 2 diabetes mellitus with other  skin ulcer L97.822 Non-pressure chronic ulcer of other part of left lower leg with fat layer exposed Z94.1 Heart transplant status Z94.0 Kidney transplant status Wound Cleansing Wound #1 Left,Anterior Lower Leg o Clean wound with Normal Saline. Anesthetic (add to Medication List) Wound #1 Left,Anterior Lower Leg o Topical Lidocaine 4% cream applied to wound bed prior to debridement (In Clinic Only). Primary Wound Dressing Wound #1 Left,Anterior Lower Leg o Silver Collagen Secondary Dressing o Boardered Foam Dressing Dressing Change Frequency Wound #1 Left,Anterior Lower Leg o Change dressing every other day. Follow-up Appointments Wound #1 Left,Anterior Lower Leg o Return Appointment in 2 weeks. Electronic Signature(s) Signed: 04/21/2018 4:52:54 PM By: Lenda Kelp PA-C Signed: 04/21/2018 5:07:26 PM By: Elliot Gurney, BSN, RN, CWS, Kim RN, BSN Entered By: Elliot Gurney, BSN, RN, CWS, Kim on 04/21/2018 14:36:15 Kelsey Allen ([id number]) -------------------------------------------------------------------------------- Problem List Details Patient Name: KILEY, SOLIMINE. Date of Service: 04/21/2018 2:30 PM Medical Record Number: [id number] Patient Account Number: [id number] Date of Birth/Sex: June 29, 1971 (47 y.o. F) Treating RN: Huel Coventry Primary Care Provider: Marisue Ivan Other Clinician: Referring Provider: Marisue Ivan Treating Provider/Extender: Linwood Dibbles,  Weeks in Treatment: 1 Active Problems ICD-10 Evaluated Encounter Code Description Active Date Today Diagnosis E11.622 Type 2 diabetes mellitus with other skin ulcer 04/14/2018 No Yes L97.822 Non-pressure chronic ulcer of other part of left lower leg with 04/14/2018 No Yes fat layer exposed Z94.1 Heart transplant status 04/14/2018 No Yes Z94.0 Kidney transplant status 04/14/2018 No Yes Inactive Problems Resolved Problems Electronic Signature(s) Signed: 04/21/2018 4:52:54 PM By: Lenda Kelp PA-C Entered  By: Lenda Kelp on 04/21/2018 14:29:10 Dawood, Lavonia M. ([id number]) -------------------------------------------------------------------------------- Progress Note Details Patient Name: Kelsey Allen. Date of Service: 04/21/2018 2:30 PM Medical Record Number: [id number] Patient Account Number: [id number] Date of Birth/Sex: 20-Mar-1971 (47 y.o. F) Treating RN: Huel Coventry Primary Care Provider: Marisue Ivan Other Clinician: Referring Provider: Marisue Ivan Treating Provider/Extender: Linwood Dibbles,  Weeks in Treatment: 1 Subjective Chief Complaint Information  obtained from Patient Left lower leg ulcer History of Present Illness (HPI) The following HPI elements were documented for the patient's wound: Associated Signs and Symptoms: Patient has a history of diabetes mellitus type II, status post heart transplant, and is status post kidney transplant. 04/14/18 on evaluation today patient actually presents for initial evaluation regarding a wound on the left anterior lower extremity. This is something that she states she noted after having a fall at the beginning of May 2019. Subsequently though it was not until somewhere around May 30 that she actually notice the wound. She does not state that there was any particular hematoma that came up following her fall in fact she's not even sure that she had a the injury to this area. She does have diabetes and her hemoglobin A1c December 2018 with 7.7. This has I believe been checked since that point but we do not have access to those records. The patient does have hearing loss and wears hearing aids. She did have a stent placed 1-2 years ago in her heart again is status post heart transplant. At this point the overall appearance of the wound so to speak is that of having what appears to be a fairly firmly attached but softened eschar on the surface of the wound although the pit appearance in general is a little bit unusual. Especially with  the very vague history of when this began I am at least somewhat concerned about the possibility of this being something other than just your typical injury/wound. Potentially this may be something more pathologic potentially even a skin cancer. Nonetheless as I explained to the patient and her mother who were present during the office visit today I do not want her to be overly worried about it but I do think that it would be appropriate for us to consider biopsy of the area to send for evaluation. Obviously if it turns out that there's nothing significant going on here that is excellent news. Otherwise if there is something that needs to be addressed by dermatology we can always make the referral at that point. No fevers, chills, nausea, or vomiting noted at this time. 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 04/21/18 on evaluation today patient's wound actually appears to be doing excellent on evaluation today. The good news is her biopsy also came back negative showing just necrotic tissue and granulation tissue. This is also excellent news. Overall I'm very pleased with how things appear at this time. Fortunately there does not appear to be any evidence of infection which is also great news. Overall I feel like she's making wonderful progress. Patient History Information obtained from Patient. Social History Never smoker, Marital Status - Divorced, Alcohol Use - Never, Drug Use - No History, Caffeine Use - Daily. Medical And Surgical History Notes Ear/Nose/Mouth/Throat hearing deficit - hearing aids in both ears Cardiovascular heart transplant 2009 Genitourinary kidney transplant 2009 Integumentary (Skin) Oley, [place] M. ([id number]) psoriasis Musculoskeletal avascular necrosis of the hip Review of Systems (ROS) Constitutional Symptoms (General Health) Denies complaints or symptoms of Fever, Chills. Respiratory The patient has no complaints or  symptoms. Cardiovascular The patient has no complaints or symptoms. Psychiatric The patient has no complaints or symptoms. Objective Constitutional Well-nourished and well-hydrated in no acute distress. Vitals Time Taken: 2:15 PM, Height: 59 in, Weight: 82 lbs, BMI: 16.6, Temperature: 98.3 F, Pulse: 109 bpm, Respiratory Rate: 18 breaths/min, Blood Pressure: 170/95 mmHg. Respiratory normal breathing without difficulty. clear to auscultation  bilaterally. Cardiovascular regular rate and rhythm with normal S1, S2. Psychiatric this patient is able to make decisions and demonstrates good insight into disease process. Alert and Oriented x 3. pleasant and cooperative. General Notes: Currently patient's wound did not require any sharp debridement it actually appears to have an excellent granulation bed. Overall very pleased in this regard. If anything we may have to watch out for hyper granulation but even that I think may be unlikely she has great epithelialization noted at this point. Integumentary (Hair, Skin) Wound #1 status is Open. Original cause of wound was Trauma. The wound is located on the Left,Anterior Lower Leg. The wound measures 1cm length x 0.8cm width x 0.1cm depth; 0.628cm^2 area and 0.063cm^3 volume. There is Fat Layer (Subcutaneous Tissue) Exposed exposed. There is no tunneling or undermining noted. There is a large amount of serous drainage noted. The wound margin is flat and intact. There is small (1-33%) red, hyper - granulation within the wound bed. There is no necrotic tissue within the wound bed. The periwound skin appearance exhibited: Maceration, Ecchymosis. The periwound skin appearance did not exhibit: Callus, Crepitus, Excoriation, Induration, Rash, Scarring, Dry/Scaly, Atrophie Blanche, Cyanosis, Hemosiderin Staining, Mottled, Pallor, Rubor, Erythema. Periwound temperature was noted as No Abnormality. DAVANNA, HE ([id number]) Assessment Active  Problems ICD-10 Type 2 diabetes mellitus with other skin ulcer Non-pressure chronic ulcer of other part of left lower leg with fat layer exposed Heart transplant status Kidney transplant status Plan Wound Cleansing: Wound #1 Left,Anterior Lower Leg: Clean wound with Normal Saline. Anesthetic (add to Medication List): Wound #1 Left,Anterior Lower Leg: Topical Lidocaine 4% cream applied to wound bed prior to debridement (In Clinic Only). Primary Wound Dressing: Wound #1 Left,Anterior Lower Leg: Silver Collagen Secondary Dressing: Boardered Foam Dressing Dressing Change Frequency: Wound #1 Left,Anterior Lower Leg: Change dressing every other day. Follow-up Appointments: Wound #1 Left,Anterior Lower Leg: Return Appointment in 2 weeks. I'm gonna suggest currently that we switch to a silver collagen dressing for the next two weeks. We'll see her back for reevaluation at that point to see were things stand. The patient is in agreement with plan as is her mother. If anything changes or worsens in the meantime she will let us know. Please see above for specific wound care orders. We will see patient for re-evaluation in 2 week(s) here in the clinic. If anything worsens or changes patient will contact our office for additional recommendations. Electronic Signature(s) Signed: 04/21/2018 4:52:54 PM By: Lenda Kelp PA-C Entered By: Lenda Kelp on 04/21/2018 14:43:17 Kelsey Allen ([id number]) -------------------------------------------------------------------------------- ROS/PFSH Details Patient Name: Kelsey Allen. Date of Service: 04/21/2018 2:30 PM Medical Record Number: [id number] Patient Account Number: [id number] Date of Birth/Sex: 1970-12-03 (47 y.o. F) Treating RN: Huel Coventry Primary Care Provider: Marisue Ivan Other Clinician: Referring Provider: Marisue Ivan Treating Provider/Extender: Linwood Dibbles,  Weeks in Treatment: 1 Information Obtained  From Patient Wound History Do you currently have one or more open woundso Yes How many open wounds do you currently haveo 1 Approximately how long have you had your woundso 5 months How have you been treating your wound(s) until nowo santyl Has your wound(s) ever healed and then re-openedo No Have you had any lab work done in the past montho No Have you tested positive for an antibiotic resistant organism (MRSA, VRE)o No Have you tested positive for osteomyelitis (bone infection)o No Have you had any tests for circulation on your legso No Have you had other problems associated with your  woundso Swelling Constitutional Symptoms (General Health) Complaints and Symptoms: Negative for: Fever; Chills Eyes Medical History: Negative for: Cataracts; Glaucoma; Optic Neuritis Ear/Nose/Mouth/Throat Medical History: Negative for: Chronic sinus problems/congestion; Middle ear problems Past Medical History Notes: hearing deficit - hearing aids in both ears Hematologic/Lymphatic Medical History: Negative for: Anemia; Hemophilia; Human Immunodeficiency Virus; Lymphedema; Sickle Cell Disease Respiratory Complaints and Symptoms: No Complaints or Symptoms Medical History: Negative for: Aspiration; Asthma; Chronic Obstructive Pulmonary Disease (COPD); Pneumothorax; Sleep Apnea; Tuberculosis Cardiovascular Complaints and Symptoms: No Complaints or Symptoms Broz, Verlee M. ([id number]) Medical History: Positive for: Hypertension Negative for: Angina; Arrhythmia; Congestive Heart Failure; Coronary Artery Disease; Deep Vein Thrombosis; Hypotension; Myocardial Infarction; Peripheral Arterial Disease; Peripheral Venous Disease; Phlebitis; Vasculitis Past Medical History Notes: heart transplant 2009 Gastrointestinal Medical History: Negative for: Cirrhosis ; Colitis; Crohnos; Hepatitis A; Hepatitis B; Hepatitis C Endocrine Medical History: Positive for: Type II Diabetes Negative for: Type I  Diabetes Genitourinary Medical History: Positive for: End Stage Renal Disease - chronic renal insufficiency Past Medical History Notes: kidney transplant 2009 Immunological Medical History: Negative for: Lupus Erythematosus; Raynaudos; Scleroderma Integumentary (Skin) Medical History: Negative for: History of Burn; History of pressure wounds Past Medical History Notes: psoriasis Musculoskeletal Medical History: Negative for: Gout; Rheumatoid Arthritis; Osteoarthritis; Osteomyelitis Past Medical History Notes: avascular necrosis of the hip Neurologic Medical History: Positive for: Neuropathy Negative for: Dementia; Quadriplegia; Paraplegia; Seizure Disorder Oncologic Medical History: Negative for: Received Chemotherapy; Received Radiation Psychiatric Complaints and Symptoms: No Complaints or Symptoms Schoeppner, Elynn M. ([id number]) Medical History: Negative for: Anorexia/bulimia; Confinement Anxiety Immunizations Pneumococcal Vaccine: Received Pneumococcal Vaccination: Yes Implantable Devices Family and Social History Never smoker; Marital Status - Divorced; Alcohol Use: Never; Drug Use: No History; Caffeine Use: Daily; Financial Concerns: No; Food, Clothing or Shelter Needs: No; Support System Lacking: No; Transportation Concerns: No; Advanced Directives: Yes (Not Provided); Patient does not want information on Advanced Directives; Living Will: Yes (Not Provided) Physician Affirmation I have reviewed and agree with the above information. Electronic Signature(s) Signed: 04/21/2018 4:52:54 PM By: Lenda Kelp PA-C Signed: 04/21/2018 5:07:26 PM By: Elliot Gurney BSN, RN, CWS, Kim RN, BSN Entered By: Lenda Kelp on 04/21/2018 14:42:32 Kelsey Allen ([id number]) -------------------------------------------------------------------------------- SuperBill Details Patient Name: Kelsey Allen. Date of Service: 04/21/2018 Medical Record Number: [id number] Patient Account Number:  [id number] Date of Birth/Sex: October 29, 1970 (47 y.o. F) Treating RN: Huel Coventry Primary Care Provider: Marisue Ivan Other Clinician: Referring Provider: Marisue Ivan Treating Provider/Extender: Linwood Dibbles,  Weeks in Treatment: 1 Diagnosis Coding ICD-10 Codes Code Description E11.622 Type 2 diabetes mellitus with other skin ulcer L97.822 Non-pressure chronic ulcer of other part of left lower leg with fat layer exposed Z94.1 Heart transplant status Z94.0 Kidney transplant status Facility Procedures CPT4 Code: [id number] Description: [phone] - WOUND CARE VISIT-LEV 2 EST PT Modifier: Quantity: 1 Physician Procedures CPT4 Code Description: [id number] - WC PHYS LEVEL 3 - EST PT ICD-10 Diagnosis Description E11.622 Type 2 diabetes mellitus with other skin ulcer L97.822 Non-pressure chronic ulcer of other part of left lower leg wit Z94.1 Heart transplant status Z94.0  Kidney transplant status Modifier: h fat layer expos Quantity: 1 ed Electronic Signature(s) Signed: 04/21/2018 4:52:54 PM By: Lenda Kelp PA-C Entered By: Lenda Kelp on 04/21/2018 14:43:31

## 2018-04-23 NOTE — Progress Notes (Signed)
SHAKIRAH, KIRKEY ([id number]) Visit Report for 04/21/2018 Arrival Information Details Patient Name: MCKAY, BRANDT. Date of Service: 04/21/2018 2:30 PM Medical Record Number: [id number] Patient Account Number: [id number] Date of Birth/Sex: 1971-04-05 (47 y.o. F) Treating RN: Huel Coventry Primary Care Shefali Ng: Marisue Ivan Other Clinician: Referring Darrol Brandenburg: Marisue Ivan Treating Moishe Schellenberg/Extender: Linwood Dibbles, HOYT Weeks in Treatment: 1 Visit Information History Since Last Visit Added or deleted any medications: No Patient Arrived: Ambulatory Any new allergies or adverse reactions: No Arrival Time: 14:15 Had a fall or experienced change in No Accompanied By: mother activities of daily living that may affect Transfer Assistance: None risk of falls: Patient Identification Verified: Yes Signs or symptoms of abuse/neglect since last visito No Secondary Verification Process Completed: Yes Hospitalized since last visit: No Patient Has Alerts: Yes Implantable device outside of the clinic excluding No Patient Alerts: DMII cellular tissue based products placed in the center aspirin 81 since last visit: Has Dressing in Place as Prescribed: Yes Pain Present Now: No Electronic Signature(s) Signed: 04/21/2018 2:51:13 PM By: Dayton Martes RCP, RRT, CHT Entered By: Weyman Rodney, Lucio Edward on 04/21/2018 14:16:11 [name] ([id number]) -------------------------------------------------------------------------------- Clinic Level of Care Assessment Details Patient Name: [name]. Date of Service: 04/21/2018 2:30 PM Medical Record Number: [id number] Patient Account Number: [id number] Date of Birth/Sex: 1971-03-22 (47 y.o. F) Treating RN: Huel Coventry Primary Care Danita Proud: Marisue Ivan Other Clinician: Referring Khriz Liddy: Marisue Ivan Treating Tariana Moldovan/Extender: Linwood Dibbles, HOYT Weeks in Treatment: 1 Clinic Level of Care Assessment Items TOOL 4  Quantity Score []  - Use when only an EandM is performed on FOLLOW-UP visit 0 ASSESSMENTS - Nursing Assessment / Reassessment []  - Reassessment of Co-morbidities (includes updates in patient status) 0 X- 1 5 Reassessment of Adherence to Treatment Plan ASSESSMENTS - Wound and Skin Assessment / Reassessment X - Simple Wound Assessment / Reassessment - one wound 1 5 []  - 0 Complex Wound Assessment / Reassessment - multiple wounds []  - 0 Dermatologic / Skin Assessment (not related to wound area) ASSESSMENTS - Focused Assessment []  - Circumferential Edema Measurements - multi extremities 0 []  - 0 Nutritional Assessment / Counseling / Intervention []  - 0 Lower Extremity Assessment (monofilament, tuning fork, pulses) []  - 0 Peripheral Arterial Disease Assessment (using hand held doppler) ASSESSMENTS - Ostomy and/or Continence Assessment and Care []  - Incontinence Assessment and Management 0 []  - 0 Ostomy Care Assessment and Management (repouching, etc.) PROCESS - Coordination of Care X - Simple Patient / Family Education for ongoing care 1 15 []  - 0 Complex (extensive) Patient / Family Education for ongoing care []  - 0 Staff obtains Chiropractor, Records, Test Results / Process Orders []  - 0 Staff telephones HHA, Nursing Homes / Clarify orders / etc []  - 0 Routine Transfer to another Facility (non-emergent condition) []  - 0 Routine Hospital Admission (non-emergent condition) []  - 0 New Admissions / Manufacturing engineer / Ordering NPWT, Apligraf, etc. []  - 0 Emergency Hospital Admission (emergent condition) X- 1 10 Simple Discharge Coordination Preslar, Annarae M. ([id number]) []  - 0 Complex (extensive) Discharge Coordination PROCESS - Special Needs []  - Pediatric / Minor Patient Management 0 []  - 0 Isolation Patient Management []  - 0 Hearing / Language / Visual special needs []  - 0 Assessment of Community assistance (transportation, D/C planning, etc.) []  - 0 Additional  assistance / Altered mentation []  - 0 Support Surface(s) Assessment (bed, cushion, seat, etc.) INTERVENTIONS - Wound Cleansing / Measurement X - Simple Wound Cleansing - one wound 1 5 []  -  0 Complex Wound Cleansing - multiple wounds X- 1 5 Wound Imaging (photographs - any number of wounds) []  - 0 Wound Tracing (instead of photographs) X- 1 5 Simple Wound Measurement - one wound []  - 0 Complex Wound Measurement - multiple wounds INTERVENTIONS - Wound Dressings []  - Small Wound Dressing one or multiple wounds 0 X- 1 15 Medium Wound Dressing one or multiple wounds []  - 0 Large Wound Dressing one or multiple wounds []  - 0 Application of Medications - topical []  - 0 Application of Medications - injection INTERVENTIONS - Miscellaneous []  - External ear exam 0 []  - 0 Specimen Collection (cultures, biopsies, blood, body fluids, etc.) []  - 0 Specimen(s) / Culture(s) sent or taken to Lab for analysis []  - 0 Patient Transfer (multiple staff / Nurse, adult / Similar devices) []  - 0 Simple Staple / Suture removal (25 or less) []  - 0 Complex Staple / Suture removal (26 or more) []  - 0 Hypo / Hyperglycemic Management (close monitor of Blood Glucose) []  - 0 Ankle / Brachial Index (ABI) - do not check if billed separately X- 1 5 Vital Signs Amis, Camilah M. ([id number]) Has the patient been seen at the hospital within the last three years: Yes Total Score: 70 Level Of Care: New/Established - Level 2 Electronic Signature(s) Signed: 04/21/2018 5:07:26 PM By: Elliot Gurney, BSN, RN, CWS, Kim RN, BSN Entered By: Elliot Gurney, BSN, RN, CWS, Kim on 04/21/2018 14:39:57 [name] ([id number]) -------------------------------------------------------------------------------- Encounter Discharge Information Details Patient Name: [name]. Date of Service: 04/21/2018 2:30 PM Medical Record Number: [id number] Patient Account Number: [id number] Date of Birth/Sex: 1970-09-26 (47 y.o. F) Treating RN:  Huel Coventry Primary Care Hatem Cull: Marisue Ivan Other Clinician: Referring Darla Mcdonald: Marisue Ivan Treating Nashonda Limberg/Extender: Linwood Dibbles, HOYT Weeks in Treatment: 1 Encounter Discharge Information Items Discharge Condition: Stable Ambulatory Status: Ambulatory Discharge Destination: Home Transportation: Private Auto Accompanied By: Mom Schedule Follow-up Appointment: Yes Clinical Summary of Care: Electronic Signature(s) Signed: 04/21/2018 5:07:26 PM By: Elliot Gurney, BSN, RN, CWS, Kim RN, BSN Entered By: Elliot Gurney, BSN, RN, CWS, Kim on 04/21/2018 14:41:25 [name] ([id number]) -------------------------------------------------------------------------------- Lower Extremity Assessment Details Patient Name: CIARAH, PEACE. Date of Service: 04/21/2018 2:30 PM Medical Record Number: [id number] Patient Account Number: [id number] Date of Birth/Sex: 1971-05-27 (47 y.o. F) Treating RN: Rema Jasmine Primary Care Fianna Snowball: Marisue Ivan Other Clinician: Referring Sylvana Bonk: Marisue Ivan Treating Chayim Bialas/Extender: Linwood Dibbles, HOYT Weeks in Treatment: 1 Edema Assessment Assessed: [Left: No] [Right: No] [Left: Edema] [Right: :] Calf Left: Right: Point of Measurement: 32 cm From Medial Instep cm cm Ankle Left: Right: Point of Measurement: 10 cm From Medial Instep cm cm Vascular Assessment Claudication: Claudication Assessment [Left:None] Pulses: Dorsalis Pedis Palpable: [Left:Yes] Posterior Tibial Extremity colors, hair growth, and conditions: Extremity Color: [Left:Normal] Hair Growth on Extremity: [Left:Yes] Temperature of Extremity: [Left:Warm] Capillary Refill: [Left:< 3 seconds] Toe Nail Assessment Left: Right: Thick: No Discolored: No Deformed: No Improper Length and Hygiene: No Electronic Signature(s) Signed: 04/21/2018 3:54:19 PM By: Rema Jasmine Entered By: Rema Jasmine on 04/21/2018 14:27:58 Sturkey, Chelsie M.  ([id number]) -------------------------------------------------------------------------------- Multi Wound Chart Details Patient Name: [name]. Date of Service: 04/21/2018 2:30 PM Medical Record Number: [id number] Patient Account Number: [id number] Date of Birth/Sex: Jul 04, 1971 (47 y.o. F) Treating RN: Huel Coventry Primary Care Angelia Hazell: Marisue Ivan Other Clinician: Referring Deren Degrazia: Marisue Ivan Treating Blanche Scovell/Extender: Linwood Dibbles, HOYT Weeks in Treatment: 1 Vital Signs Height(in): 59 Pulse(bpm): 109 Weight(lbs): 82 Blood Pressure(mmHg): 170/95 Body Mass Index(BMI): 17 Temperature(F): 98.3 Respiratory Rate  18 (breaths/min): Photos: [N/A:N/A] Wound Location: Left Lower Leg - Anterior N/A N/A Wounding Event: Trauma N/A N/A Primary Etiology: Diabetic Wound/Ulcer of the N/A N/A Lower Extremity Comorbid History: Hypertension, Type II N/A N/A Diabetes, End Stage Renal Disease, Neuropathy Date Acquired: 12/13/2017 N/A N/A Weeks of Treatment: 1 N/A N/A Wound Status: Open N/A N/A Measurements L x W x D 1x0.8x0.1 N/A N/A (cm) Area (cm) : 0.628 N/A N/A Volume (cm) : 0.063 N/A N/A % Reduction in Area: 0.00% N/A N/A % Reduction in Volume: 0.00% N/A N/A Classification: Grade 1 N/A N/A Exudate Amount: Large N/A N/A Exudate Type: Serous N/A N/A Exudate Color: amber N/A N/A Wound Margin: Flat and Intact N/A N/A Granulation Amount: Small (1-33%) N/A N/A Granulation Quality: Red, Hyper-granulation N/A N/A Necrotic Amount: None Present (0%) N/A N/A Exposed Structures: Fat Layer (Subcutaneous N/A N/A Tissue) Exposed: Yes Fascia: No Tendon: No Muscle: No Callejo, Kennya M. ([id number]) Joint: No Bone: No Epithelialization: None N/A N/A Periwound Skin Texture: Excoriation: No N/A N/A Induration: No Callus: No Crepitus: No Rash: No Scarring: No Periwound Skin Moisture: Maceration: Yes N/A N/A Dry/Scaly: No Periwound Skin Color: Ecchymosis: Yes N/A  N/A Atrophie Blanche: No Cyanosis: No Erythema: No Hemosiderin Staining: No Mottled: No Pallor: No Rubor: No Temperature: No Abnormality N/A N/A Tenderness on Palpation: No N/A N/A Wound Preparation: Ulcer Cleansing: N/A N/A Rinsed/Irrigated with Saline Topical Anesthetic Applied: Other: lidocaine 4% Treatment Notes Electronic Signature(s) Signed: 04/21/2018 5:07:26 PM By: Elliot GurneyWoody, BSN, RN, CWS, Kim RN, BSN Entered By: Elliot GurneyWoody, BSN, RN, CWS, Kim on 04/21/2018 14:34:51 Milinda AntisHESTER, Amber M. ([id number]) -------------------------------------------------------------------------------- Multi-Disciplinary Care Plan Details Patient Name: Milinda AntisHESTER, Percy M. Date of Service: 04/21/2018 2:30 PM Medical Record Number: [id number] Patient Account Number: [id number] Date of Birth/Sex: 07/27/1971 (47 y.o. F) Treating RN: Huel CoventryWoody, Kim Primary Care Vitor Overbaugh: Marisue IvanLinthavong, Kanhka Other Clinician: Referring Archit Leger: Marisue IvanLinthavong, Kanhka Treating Jessye Imhoff/Extender: Linwood DibblesSTONE III, HOYT Weeks in Treatment: 1 Active Inactive ` Orientation to the Wound Care Program Nursing Diagnoses: Knowledge deficit related to the wound healing center program Goals: Patient/caregiver will verbalize understanding of the Wound Healing Center Program Date Initiated: 04/14/2018 Target Resolution Date: 05/05/2018 Goal Status: Active Interventions: Provide education on orientation to the wound center Notes: ` Wound/Skin Impairment Nursing Diagnoses: Impaired tissue integrity Goals: Patient/caregiver will verbalize understanding of skin care regimen Date Initiated: 04/14/2018 Target Resolution Date: 05/05/2018 Goal Status: Active Ulcer/skin breakdown will have a volume reduction of 30% by week 4 Date Initiated: 04/14/2018 Target Resolution Date: 05/05/2018 Goal Status: Active Interventions: Assess patient/caregiver ability to obtain necessary supplies Assess patient/caregiver ability to perform ulcer/skin care regimen upon admission  and as needed Assess ulceration(s) every visit Treatment Activities: Skin care regimen initiated : 04/14/2018 Notes: Electronic Signature(s) Signed: 04/21/2018 5:07:26 PM By: Elliot GurneyWoody, BSN, RN, CWS, Kim RN, BSN DublinHESTER, Saya ColmaM. ([id number]) Entered By: Elliot GurneyWoody, BSN, RN, CWS, Kim on 04/21/2018 14:34:38 Milinda AntisHESTER, Jesslyn M. ([id number]) -------------------------------------------------------------------------------- Pain Assessment Details Patient Name: Milinda AntisHESTER, Yonna M. Date of Service: 04/21/2018 2:30 PM Medical Record Number: [id number] Patient Account Number: [id number] Date of Birth/Sex: 02/14/1971 (47 y.o. F) Treating RN: Huel CoventryWoody, Kim Primary Care Dallas Scorsone: Marisue IvanLinthavong, Kanhka Other Clinician: Referring Pardeep Pautz: Marisue IvanLinthavong, Kanhka Treating Gulianna Hornsby/Extender: Linwood DibblesSTONE III, HOYT Weeks in Treatment: 1 Active Problems Location of Pain Severity and Description of Pain Patient Has Paino No Site Locations Pain Management and Medication Current Pain Management: Electronic Signature(s) Signed: 04/21/2018 2:51:13 PM By: Dayton MartesWallace, RCP,RRT,CHT, Sallie RCP, RRT, CHT Signed: 04/21/2018 5:07:26 PM By: Elliot GurneyWoody, BSN, RN, CWS, Kim RN, BSN Entered By: Earlene PlaterWallace,  RCP,RRT,CHT, Sallie on 04/21/2018 14:16:20 LIYA, STROLLO ([id number]) -------------------------------------------------------------------------------- Patient/Caregiver Education Details Patient Name: BAILLIE, MOHAMMAD. Date of Service: 04/21/2018 2:30 PM Medical Record Number: [id number] Patient Account Number: [id number] Date of Birth/Gender: Sep 23, 1970 (47 y.o. F) Treating RN: Huel Coventry Primary Care Physician: Marisue Ivan Other Clinician: Referring Physician: Marisue Ivan Treating Physician/Extender: Skeet Simmer in Treatment: 1 Education Assessment Education Provided To: Patient Education Topics Provided Wound/Skin Impairment: Handouts: Caring for Your Ulcer Methods: Demonstration, Explain/Verbal Responses: State content  correctly Electronic Signature(s) Signed: 04/21/2018 5:07:26 PM By: Elliot Gurney, BSN, RN, CWS, Kim RN, BSN Entered By: Elliot Gurney, BSN, RN, CWS, Kim on 04/21/2018 14:41:39 [name] ([id number]) -------------------------------------------------------------------------------- Wound Assessment Details Patient Name: [name]. Date of Service: 04/21/2018 2:30 PM Medical Record Number: [id number] Patient Account Number: [id number] Date of Birth/Sex: 03/10/1971 (47 y.o. F) Treating RN: Rema Jasmine Primary Care Javaria Knapke: Marisue Ivan Other Clinician: Referring Auryn Paige: Marisue Ivan Treating Allizon Woznick/Extender: Linwood Dibbles, HOYT Weeks in Treatment: 1 Wound Status Wound Number: 1 Primary Diabetic Wound/Ulcer of the Lower Extremity Etiology: Wound Location: Left Lower Leg - Anterior Wound Open Wounding Event: Trauma Status: Date Acquired: 12/13/2017 Comorbid Hypertension, Type II Diabetes, End Stage Weeks Of Treatment: 1 History: Renal Disease, Neuropathy Clustered Wound: No Photos Photo Uploaded By: Rema Jasmine on 04/21/2018 14:33:05 Wound Measurements Length: (cm) 1 Width: (cm) 0.8 Depth: (cm) 0.1 Area: (cm) 0.628 Volume: (cm) 0.063 % Reduction in Area: 0% % Reduction in Volume: 0% Epithelialization: None Tunneling: No Undermining: No Wound Description Classification: Grade 1 Foul Odor Wound Margin: Flat and Intact Slough/Fib Exudate Amount: Large Exudate Type: Serous Exudate Color: amber After Cleansing: No rino Yes Wound Bed Granulation Amount: Small (1-33%) Exposed Structure Granulation Quality: Red, Hyper-granulation Fascia Exposed: No Necrotic Amount: None Present (0%) Fat Layer (Subcutaneous Tissue) Exposed: Yes Tendon Exposed: No Muscle Exposed: No Joint Exposed: No Bone Exposed: No Periwound Skin Texture Cuttino, Oralia M. ([id number]) Texture Color No Abnormalities Noted: No No Abnormalities Noted: No Callus: No Atrophie Blanche: No Crepitus:  No Cyanosis: No Excoriation: No Ecchymosis: Yes Induration: No Erythema: No Rash: No Hemosiderin Staining: No Scarring: No Mottled: No Pallor: No Moisture Rubor: No No Abnormalities Noted: No Dry / Scaly: No Temperature / Pain Maceration: Yes Temperature: No Abnormality Wound Preparation Ulcer Cleansing: Rinsed/Irrigated with Saline Topical Anesthetic Applied: Other: lidocaine 4%, Treatment Notes Wound #1 (Left, Anterior Lower Leg) 1. Cleansed with: Cleanse wound with antibacterial soap and water 2. Anesthetic Topical Lidocaine 4% cream to wound bed prior to debridement 4. Dressing Applied: Prisma Ag 5. Secondary Dressing Applied Bordered Foam Dressing Electronic Signature(s) Signed: 04/21/2018 3:54:19 PM By: Rema Jasmine Entered By: Rema Jasmine on 04/21/2018 14:29:31 Chavous, Dionne M. ([id number]) -------------------------------------------------------------------------------- Vitals Details Patient Name: [name]. Date of Service: 04/21/2018 2:30 PM Medical Record Number: [id number] Patient Account Number: [id number] Date of Birth/Sex: [birth year] (47 y.o. F) Treating RN: Huel Coventry Primary Care Arrabella Westerman: Marisue Ivan Other Clinician: Referring Daryle Amis: Marisue Ivan Treating Aleina Burgio/Extender: Linwood Dibbles, HOYT Weeks in Treatment: 1 Vital Signs Time Taken: 14:15 Temperature (F): 98.3 Height (in): 59 Pulse (bpm): 109 Weight (lbs): 82 Respiratory Rate (breaths/min): 18 Body Mass Index (BMI): 16.6 Blood Pressure (mmHg): 170/95 Reference Range: 80 - 120 mg / dl Electronic Signature(s) Signed: 04/21/2018 2:51:13 PM By: Dayton Martes RCP, RRT, CHT Entered By: Weyman Rodney, Lucio Edward on 04/21/2018 14:19:09

## 2018-05-05 ENCOUNTER — Encounter: Payer: 59 | Attending: Physician Assistant | Admitting: Physician Assistant

## 2018-05-05 DIAGNOSIS — N186 End stage renal disease: Secondary | ICD-10-CM | POA: Diagnosis not present

## 2018-05-05 DIAGNOSIS — E1122 Type 2 diabetes mellitus with diabetic chronic kidney disease: Secondary | ICD-10-CM | POA: Insufficient documentation

## 2018-05-05 DIAGNOSIS — Z09 Encounter for follow-up examination after completed treatment for conditions other than malignant neoplasm: Secondary | ICD-10-CM | POA: Insufficient documentation

## 2018-05-05 DIAGNOSIS — I120 Hypertensive chronic kidney disease with stage 5 chronic kidney disease or end stage renal disease: Secondary | ICD-10-CM | POA: Diagnosis not present

## 2018-05-05 DIAGNOSIS — Z8631 Personal history of diabetic foot ulcer: Secondary | ICD-10-CM | POA: Insufficient documentation

## 2018-05-05 DIAGNOSIS — Z941 Heart transplant status: Secondary | ICD-10-CM | POA: Insufficient documentation

## 2018-05-05 DIAGNOSIS — Z940 Kidney transplant status: Secondary | ICD-10-CM | POA: Insufficient documentation

## 2018-05-05 DIAGNOSIS — E1140 Type 2 diabetes mellitus with diabetic neuropathy, unspecified: Secondary | ICD-10-CM | POA: Diagnosis not present

## 2018-05-07 NOTE — Progress Notes (Signed)
KENOSHA, DOSTER ([id number]) Visit Report for 05/05/2018 Chief Complaint Document Details Patient Name: Kelsey Allen, Kelsey Allen. Date of Service: 05/05/2018 3:00 PM Medical Record Number: [id number] Patient Account Number: [id number] Date of Birth/Sex: [birth year] (47 y.o. F) Treating RN: Renne Crigler Primary Care Provider: Marisue Ivan Other Clinician: Referring Provider: Marisue Ivan Treating Provider/Extender: Linwood Dibbles, HOYT Weeks in Treatment: 3 Information Obtained from: Patient Chief Complaint Left lower leg ulcer Electronic Signature(s) Signed: 05/05/2018 4:36:35 PM By: Lenda Kelp PA-C Entered By: Lenda Kelp on 05/05/2018 15:21:41 Kelsey Allen ([id number]) -------------------------------------------------------------------------------- HPI Details Patient Name: Kelsey Allen. Date of Service: 05/05/2018 3:00 PM Medical Record Number: [id number] Patient Account Number: [id number] Date of Birth/Sex: [birth year] (47 y.o. F) Treating RN: Renne Crigler Primary Care Provider: Marisue Ivan Other Clinician: Referring Provider: Marisue Ivan Treating Provider/Extender: Linwood Dibbles, HOYT Weeks in Treatment: 3 History of Present Illness Associated Signs and Symptoms: Patient has a history of diabetes mellitus type II, status post heart transplant, and is status post kidney transplant. HPI Description: 04/14/18 on evaluation today patient actually presents for initial evaluation regarding a wound on the left anterior lower extremity. This is something that she states she noted after having a fall at the beginning of May 2019. Subsequently though it was not until somewhere around May 30 that she actually notice the wound. She does not state that there was any particular hematoma that came up following her fall in fact she's not even sure that she had a the injury to this area. She does have diabetes and her hemoglobin A1c December 2018 with 7.7. This has I  believe been checked since that point but we do not have access to those records. The patient does have hearing loss and wears hearing aids. She did have a stent placed 1-2 years ago in her heart again is status post heart transplant. At this point the overall appearance of the wound so to speak is that of having what appears to be a fairly firmly attached but softened eschar on the surface of the wound although the pit appearance in general is a little bit unusual. Especially with the very vague history of when this began I am at least somewhat concerned about the possibility of this being something other than just your typical injury/wound. Potentially this may be something more pathologic potentially even a skin cancer. Nonetheless as I explained to the patient and her mother who were present during the office visit today I do not want her to be overly worried about it but I do think that it would be appropriate for [place] to consider biopsy of the area to send for evaluation. Obviously if it turns out that there's nothing significant going on here that is excellent news. Otherwise if there is something that needs to be addressed by dermatology we can always make the referral at that point. No fevers, chills, nausea, or vomiting noted at this time. 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 04/21/18 on evaluation today patient's wound actually appears to be doing excellent on evaluation today. The good news is her biopsy also came back negative showing just necrotic tissue and granulation tissue. This is also excellent news. Overall I'm very pleased with how things appear at this time. Fortunately there does not appear to be any evidence of infection which is also great news. Overall I feel like she's making wonderful progress. 05/05/18 on evaluation today patient actually appears to be doing excellent in regard to  her lower Trinity ulcer. In fact this appears to be  completely healed which is excellent news. Overall there's no evidence of infection. Electronic Signature(s) Signed: 05/05/2018 4:36:35 PM By: Lenda Kelp PA-C Entered By: Lenda Kelp on 05/05/2018 15:40:56 Kelsey Allen ([id number]) -------------------------------------------------------------------------------- Physical Exam Details Patient Name: Kelsey Allen. Date of Service: 05/05/2018 3:00 PM Medical Record Number: [id number] Patient Account Number: [id number] Date of Birth/Sex: [birth year] (47 y.o. F) Treating RN: Renne Crigler Primary Care Provider: Marisue Ivan Other Clinician: Referring Provider: Marisue Ivan Treating Provider/Extender: Linwood Dibbles, HOYT Weeks in Treatment: 3 Constitutional Well-nourished and well-hydrated in no acute distress. Respiratory normal breathing without difficulty. Psychiatric this patient is able to make decisions and demonstrates good insight into disease process. Alert and Oriented x 3. pleasant and cooperative. Notes Patient's wound bed currently show signs of good improvement which is great news she has complete epithelialization which is excellent. There's no evidence of infection at this time. Overall I feel like She has done very well. Electronic Signature(s) Signed: 05/05/2018 4:36:35 PM By: Lenda Kelp PA-C Entered By: Lenda Kelp on 05/05/2018 15:41:37 Kelsey Allen ([id number]) -------------------------------------------------------------------------------- Physician Orders Details Patient Name: Kelsey Allen. Date of Service: 05/05/2018 3:00 PM Medical Record Number: [id number] Patient Account Number: [id number] Date of Birth/Sex: [birth year] (47 y.o. F) Treating RN: Renne Crigler Primary Care Provider: Marisue Ivan Other Clinician: Referring Provider: Marisue Ivan Treating Provider/Extender: Linwood Dibbles, HOYT Weeks in Treatment: 3 Verbal / Phone Orders: No Diagnosis Coding ICD-10  Coding Code Description E11.622 Type 2 diabetes mellitus with other skin ulcer L97.822 Non-pressure chronic ulcer of other part of left lower leg with fat layer exposed Z94.1 Heart transplant status Z94.0 Kidney transplant status Discharge From [facility] Services Wound #1 Left,Anterior Lower Leg o Discharge from Wound Care Center - treatment completed Electronic Signature(s) Signed: 05/05/2018 4:36:35 PM By: Lenda Kelp PA-C Signed: 05/05/2018 4:46:50 PM By: Renne Crigler Entered By: Renne Crigler on 05/05/2018 15:29:04 Kelsey Allen ([id number]) -------------------------------------------------------------------------------- Problem List Details Patient Name: Kelsey Allen. Date of Service: 05/05/2018 3:00 PM Medical Record Number: [id number] Patient Account Number: [id number] Date of Birth/Sex: [birth year] (47 y.o. F) Treating RN: Renne Crigler Primary Care Provider: Marisue Ivan Other Clinician: Referring Provider: Marisue Ivan Treating Provider/Extender: Linwood Dibbles, HOYT Weeks in Treatment: 3 Active Problems ICD-10 Evaluated Encounter Code Description Active Date Today Diagnosis E11.622 Type 2 diabetes mellitus with other skin ulcer 04/14/2018 No Yes L97.822 Non-pressure chronic ulcer of other part of left lower leg with 04/14/2018 No Yes fat layer exposed Z94.1 Heart transplant status 04/14/2018 No Yes Z94.0 Kidney transplant status 04/14/2018 No Yes Inactive Problems Resolved Problems Electronic Signature(s) Signed: 05/05/2018 4:36:35 PM By: Lenda Kelp PA-C Entered By: Lenda Kelp on 05/05/2018 15:21:36 Marcy, Feige M. ([id number]) -------------------------------------------------------------------------------- Progress Note Details Patient Name: Kelsey Allen. Date of Service: 05/05/2018 3:00 PM Medical Record Number: [id number] Patient Account Number: [id number] Date of Birth/Sex: 13-Aug-1970 (47 y.o. F) Treating RN: Renne Crigler Primary  Care Provider: Marisue Ivan Other Clinician: Referring Provider: Marisue Ivan Treating Provider/Extender: Linwood Dibbles, HOYT Weeks in Treatment: 3 Subjective Chief Complaint Information obtained from Patient Left lower leg ulcer History of Present Illness (HPI) The following HPI elements were documented for the patient's wound: Associated Signs and Symptoms: Patient has a history of diabetes mellitus type II, status post heart transplant, and is status post kidney transplant. 04/14/18 on evaluation today patient actually presents for initial evaluation regarding a wound on the left anterior lower  extremity. This is something that she states she noted after having a fall at the beginning of May 2019. Subsequently though it was not until somewhere around May 30 that she actually notice the wound. She does not state that there was any particular hematoma that came up following her fall in fact she's not even sure that she had a the injury to this area. She does have diabetes and her hemoglobin A1c December 2018 with 7.7. This has I believe been checked since that point but we do not have access to those records. The patient does have hearing loss and wears hearing aids. She did have a stent placed 1-2 years ago in her heart again is status post heart transplant. At this point the overall appearance of the wound so to speak is that of having what appears to be a fairly firmly attached but softened eschar on the surface of the wound although the pit appearance in general is a little bit unusual. Especially with the very vague history of when this began I am at least somewhat concerned about the possibility of this being something other than just your typical injury/wound. Potentially this may be something more pathologic potentially even a skin cancer. Nonetheless as I explained to the patient and her mother who were present during the office visit today I do not want her to be overly  worried about it but I do think that it would be appropriate for [place] to consider biopsy of the area to send for evaluation. Obviously if it turns out that there's nothing significant going on here that is excellent news. Otherwise if there is something that needs to be addressed by dermatology we can always make the referral at that point. No fevers, chills, nausea, or vomiting noted at this time. 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 04/21/18 on evaluation today patient's wound actually appears to be doing excellent on evaluation today. The good news is her biopsy also came back negative showing just necrotic tissue and granulation tissue. This is also excellent news. Overall I'm very pleased with how things appear at this time. Fortunately there does not appear to be any evidence of infection which is also great news. Overall I feel like she's making wonderful progress. 05/05/18 on evaluation today patient actually appears to be doing excellent in regard to her lower Trinity ulcer. In fact this appears to be completely healed which is excellent news. Overall there's no evidence of infection. Patient History Information obtained from Patient. Social History Never smoker, Marital Status - Divorced, Alcohol Use - Never, Drug Use - No History, Caffeine Use - Daily. Medical And Surgical History Notes Ear/Nose/Mouth/Throat hearing deficit - hearing aids in both ears Cardiovascular heart transplant 2009 RETIA, CORDLE. ([id number]) Genitourinary kidney transplant 2009 Integumentary (Skin) psoriasis Musculoskeletal avascular necrosis of the hip Review of Systems (ROS) Constitutional Symptoms (General Health) Denies complaints or symptoms of Fever, Chills. Respiratory The patient has no complaints or symptoms. Cardiovascular The patient has no complaints or symptoms. Psychiatric The patient has no complaints or  symptoms. Objective Constitutional Well-nourished and well-hydrated in no acute distress. Vitals Time Taken: 3:00 PM, Height: 59 in, Weight: 82 lbs, BMI: 16.6, Temperature: 97.9 F, Pulse: 107 bpm, Respiratory Rate: 18 breaths/min, Blood Pressure: 181/101 mmHg. Respiratory normal breathing without difficulty. Psychiatric this patient is able to make decisions and demonstrates good insight into disease process. Alert and Oriented x 3. pleasant and cooperative. General Notes: Patient's wound bed currently show  signs of good improvement which is great news she has complete epithelialization which is excellent. There's no evidence of infection at this time. Overall I feel like She has done very well. Integumentary (Hair, Skin) Wound #1 status is Healed - Epithelialized. Original cause of wound was Trauma. The wound is located on the Left,Anterior Lower Leg. The wound measures 0cm length x 0cm width x 0cm depth; 0cm^2 area and 0cm^3 volume. There is Fat Layer (Subcutaneous Tissue) Exposed exposed. There is no tunneling or undermining noted. There is a none present amount of drainage noted. The wound margin is flat and intact. There is no granulation within the wound bed. There is no necrotic tissue within the wound bed. The periwound skin appearance exhibited: Maceration, Ecchymosis. The periwound skin appearance did not exhibit: Callus, Crepitus, Excoriation, Induration, Rash, Scarring, Dry/Scaly, Atrophie Blanche, Cyanosis, Hemosiderin Staining, Mottled, Pallor, Rubor, Erythema. Periwound temperature was noted as No Abnormality. HARMONEY, SIENKIEWICZ ([id number]) Assessment Active Problems ICD-10 Type 2 diabetes mellitus with other skin ulcer Non-pressure chronic ulcer of other part of left lower leg with fat layer exposed Heart transplant status Kidney transplant status Plan Discharge From [facility] Services: Wound #1 Left,Anterior Lower Leg: Discharge from Wound Care Center - treatment  completed At this point my suggestion is gonna be that we discontinue wound care services since everything appears to be healed. She's in agreement the plan. Will subsequently see her back for reevaluation in future as needed for anything changes or worsens. Electronic Signature(s) Signed: 05/05/2018 4:36:35 PM By: Lenda Kelp PA-C Entered By: Lenda Kelp on 05/05/2018 15:42:01 Kelsey Allen ([id number]) -------------------------------------------------------------------------------- ROS/PFSH Details Patient Name: Kelsey Allen. Date of Service: 05/05/2018 3:00 PM Medical Record Number: [id number] Patient Account Number: [id number] Date of Birth/Sex: [birth year] (47 y.o. F) Treating RN: Renne Crigler Primary Care Provider: Marisue Ivan Other Clinician: Referring Provider: Marisue Ivan Treating Provider/Extender: Linwood Dibbles, HOYT Weeks in Treatment: 3 Information Obtained From Patient Wound History Do you currently have one or more open woundso Yes How many open wounds do you currently haveo 1 Approximately how long have you had your woundso 5 months How have you been treating your wound(s) until nowo santyl Has your wound(s) ever healed and then re-openedo No Have you had any lab work done in the past montho No Have you tested positive for an antibiotic resistant organism (MRSA, VRE)o No Have you tested positive for osteomyelitis (bone infection)o No Have you had any tests for circulation on your legso No Have you had other problems associated with your woundso Swelling Constitutional Symptoms (General Health) Complaints and Symptoms: Negative for: Fever; Chills Eyes Medical History: Negative for: Cataracts; Glaucoma; Optic Neuritis Ear/Nose/Mouth/Throat Medical History: Negative for: Chronic sinus problems/congestion; Middle ear problems Past Medical History Notes: hearing deficit - hearing aids in both ears Hematologic/Lymphatic Medical  History: Negative for: Anemia; Hemophilia; Human Immunodeficiency Virus; Lymphedema; Sickle Cell Disease Respiratory Complaints and Symptoms: No Complaints or Symptoms Medical History: Negative for: Aspiration; Asthma; Chronic Obstructive Pulmonary Disease (COPD); Pneumothorax; Sleep Apnea; Tuberculosis Cardiovascular Complaints and Symptoms: No Complaints or Symptoms Villada, Lashonna M. ([id number]) Medical History: Positive for: Hypertension Negative for: Angina; Arrhythmia; Congestive Heart Failure; Coronary Artery Disease; Deep Vein Thrombosis; Hypotension; Myocardial Infarction; Peripheral Arterial Disease; Peripheral Venous Disease; Phlebitis; Vasculitis Past Medical History Notes: heart transplant 2009 Gastrointestinal Medical History: Negative for: Cirrhosis ; Colitis; Crohnos; Hepatitis A; Hepatitis B; Hepatitis C Endocrine Medical History: Positive for: Type II Diabetes Negative for: Type I Diabetes Genitourinary Medical History: Positive for:  End Stage Renal Disease - chronic renal insufficiency Past Medical History Notes: kidney transplant 2009 Immunological Medical History: Negative for: Lupus Erythematosus; Raynaudos; Scleroderma Integumentary (Skin) Medical History: Negative for: History of Burn; History of pressure wounds Past Medical History Notes: psoriasis Musculoskeletal Medical History: Negative for: Gout; Rheumatoid Arthritis; Osteoarthritis; Osteomyelitis Past Medical History Notes: avascular necrosis of the hip Neurologic Medical History: Positive for: Neuropathy Negative for: Dementia; Quadriplegia; Paraplegia; Seizure Disorder Oncologic Medical History: Negative for: Received Chemotherapy; Received Radiation Psychiatric Complaints and Symptoms: No Complaints or Symptoms Koslosky, Love M. ([id number]) Medical History: Negative for: Anorexia/bulimia; Confinement Anxiety Immunizations Pneumococcal Vaccine: Received Pneumococcal Vaccination:  Yes Implantable Devices Family and Social History Never smoker; Marital Status - Divorced; Alcohol Use: Never; Drug Use: No History; Caffeine Use: Daily; Financial Concerns: No; Food, Clothing or Shelter Needs: No; Support System Lacking: No; Transportation Concerns: No; Advanced Directives: Yes (Not Provided); Patient does not want information on Advanced Directives; Living Will: Yes (Not Provided) Physician Affirmation I have reviewed and agree with the above information. Electronic Signature(s) Signed: 05/05/2018 4:36:35 PM By: Lenda Kelp PA-C Signed: 05/05/2018 4:46:50 PM By: Renne Crigler Entered By: Lenda Kelp on 05/05/2018 15:41:14 Kelsey Allen ([id number]) -------------------------------------------------------------------------------- SuperBill Details Patient Name: Kelsey Allen. Date of Service: 05/05/2018 Medical Record Number: [id number] Patient Account Number: [id number] Date of Birth/Sex: [birth year] (47 y.o. F) Treating RN: Renne Crigler Primary Care Provider: Marisue Ivan Other Clinician: Referring Provider: Marisue Ivan Treating Provider/Extender: Linwood Dibbles, HOYT Weeks in Treatment: 3 Diagnosis Coding ICD-10 Codes Code Description E11.622 Type 2 diabetes mellitus with other skin ulcer L97.822 Non-pressure chronic ulcer of other part of left lower leg with fat layer exposed Z94.1 Heart transplant status Z94.0 Kidney transplant status Facility Procedures CPT4 Code: [id number] Description: [phone] - WOUND CARE VISIT-LEV 2 EST PT Modifier: Quantity: 1 Physician Procedures CPT4 Code Description: [id number] - WC PHYS LEVEL 2 - EST PT ICD-10 Diagnosis Description E11.622 Type 2 diabetes mellitus with other skin ulcer Z94.1 Heart transplant status L97.822 Non-pressure chronic ulcer of other part of left lower leg wit Z94.0  Kidney transplant status Modifier: h fat layer expos Quantity: 1 ed Electronic Signature(s) Signed: 05/05/2018 4:36:35  PM By: Lenda Kelp PA-C Entered By: Lenda Kelp on 05/05/2018 15:42:21

## 2018-05-07 NOTE — Progress Notes (Signed)
ARYEL, EDELEN ([id number]) Visit Report for 05/05/2018 Arrival Information Details Patient Name: Kelsey Allen, Kelsey Allen. Date of Service: 05/05/2018 3:00 PM Medical Record Number: [id number] Patient Account Number: [id number] Date of Birth/Sex: Dec 29, 1970 (47 y.o. F) Treating RN: Renne Crigler Primary Care Justyne Roell: Marisue Ivan Other Clinician: Referring Yisroel Mullendore: Marisue Ivan Treating Morgan Rennert/Extender: Linwood Dibbles, HOYT Weeks in Treatment: 3 Visit Information History Since Last Visit Added or deleted any medications: No Patient Arrived: Ambulatory Any new allergies or adverse reactions: No Arrival Time: 15:03 Had a fall or experienced change in No Accompanied By: mother activities of daily living that may affect Transfer Assistance: None risk of falls: Patient Identification Verified: Yes Signs or symptoms of abuse/neglect since last visito No Secondary Verification Process Completed: Yes Hospitalized since last visit: No Patient Has Alerts: Yes Implantable device outside of the clinic excluding No Patient Alerts: DMII cellular tissue based products placed in the center aspirin 81 since last visit: Has Dressing in Place as Prescribed: Yes Pain Present Now: No Electronic Signature(s) Signed: 05/05/2018 4:01:29 PM By: Dayton Martes RCP, RRT, CHT Entered By: Weyman Rodney, Lucio Edward on 05/05/2018 15:03:59 Belkin, Faviola Judie Petit ([id number]) -------------------------------------------------------------------------------- Clinic Level of Care Assessment Details Patient Name: Kelsey Allen. Date of Service: 05/05/2018 3:00 PM Medical Record Number: [id number] Patient Account Number: [id number] Date of Birth/Sex: November 22, 1970 (47 y.o. F) Treating RN: Renne Crigler Primary Care Novalie Leamy: Marisue Ivan Other Clinician: Referring Omario Ander: Marisue Ivan Treating Vikkie Goeden/Extender: Linwood Dibbles, HOYT Weeks in Treatment: 3 Clinic Level of Care Assessment  Items TOOL 4 Quantity Score X - Use when only an EandM is performed on FOLLOW-UP visit 1 0 ASSESSMENTS - Nursing Assessment / Reassessment X - Reassessment of Co-morbidities (includes updates in patient status) 1 10 X- 1 5 Reassessment of Adherence to Treatment Plan ASSESSMENTS - Wound and Skin Assessment / Reassessment X - Simple Wound Assessment / Reassessment - one wound 1 5 []  - 0 Complex Wound Assessment / Reassessment - multiple wounds []  - 0 Dermatologic / Skin Assessment (not related to wound area) ASSESSMENTS - Focused Assessment []  - Circumferential Edema Measurements - multi extremities 0 []  - 0 Nutritional Assessment / Counseling / Intervention []  - 0 Lower Extremity Assessment (monofilament, tuning fork, pulses) []  - 0 Peripheral Arterial Disease Assessment (using hand held doppler) ASSESSMENTS - Ostomy and/or Continence Assessment and Care []  - Incontinence Assessment and Management 0 []  - 0 Ostomy Care Assessment and Management (repouching, etc.) PROCESS - Coordination of Care X - Simple Patient / Family Education for ongoing care 1 15 []  - 0 Complex (extensive) Patient / Family Education for ongoing care []  - 0 Staff obtains Chiropractor, Records, Test Results / Process Orders []  - 0 Staff telephones HHA, Nursing Homes / Clarify orders / etc []  - 0 Routine Transfer to another Facility (non-emergent condition) []  - 0 Routine Hospital Admission (non-emergent condition) []  - 0 New Admissions / Manufacturing engineer / Ordering NPWT, Apligraf, etc. []  - 0 Emergency Hospital Admission (emergent condition) X- 1 10 Simple Discharge Coordination Kelsey Allen, Kelsey M. ([id number]) []  - 0 Complex (extensive) Discharge Coordination PROCESS - Special Needs []  - Pediatric / Minor Patient Management 0 []  - 0 Isolation Patient Management []  - 0 Hearing / Language / Visual special needs []  - 0 Assessment of Community assistance (transportation, D/C planning, etc.) []  -  0 Additional assistance / Altered mentation []  - 0 Support Surface(s) Assessment (bed, cushion, seat, etc.) INTERVENTIONS - Wound Cleansing / Measurement X - Simple Wound Cleansing - one wound 1  5 []  - 0 Complex Wound Cleansing - multiple wounds X- 1 5 Wound Imaging (photographs - any number of wounds) []  - 0 Wound Tracing (instead of photographs) X- 1 5 Simple Wound Measurement - one wound []  - 0 Complex Wound Measurement - multiple wounds INTERVENTIONS - Wound Dressings X - Small Wound Dressing one or multiple wounds 1 10 []  - 0 Medium Wound Dressing one or multiple wounds []  - 0 Large Wound Dressing one or multiple wounds []  - 0 Application of Medications - topical []  - 0 Application of Medications - injection INTERVENTIONS - Miscellaneous []  - External ear exam 0 []  - 0 Specimen Collection (cultures, biopsies, blood, body fluids, etc.) []  - 0 Specimen(s) / Culture(s) sent or taken to Lab for analysis []  - 0 Patient Transfer (multiple staff / Nurse, adult / Similar devices) []  - 0 Simple Staple / Suture removal (25 or less) []  - 0 Complex Staple / Suture removal (26 or more) []  - 0 Hypo / Hyperglycemic Management (close monitor of Blood Glucose) []  - 0 Ankle / Brachial Index (ABI) - do not check if billed separately X- 1 5 Vital Signs Kelsey Allen, Kelsey M. ([id number]) Has the patient been seen at the hospital within the last three years: Yes Total Score: 75 Level Of Care: New/Established - Level 2 Electronic Signature(s) Signed: 05/05/2018 4:46:50 PM By: Renne Crigler Entered By: Renne Crigler on 05/05/2018 15:29:28 Kelsey Allen ([id number]) -------------------------------------------------------------------------------- Encounter Discharge Information Details Patient Name: Kelsey Allen. Date of Service: 05/05/2018 3:00 PM Medical Record Number: [id number] Patient Account Number: [id number] Date of Birth/Sex: [birth year] (47 y.o. F) Treating RN: Renne Crigler Primary Care Dain Laseter: Marisue Ivan Other Clinician: Referring Kamiah Fite: Marisue Ivan Treating Susumu Hackler/Extender: Skeet Simmer in Treatment: 3 Encounter Discharge Information Items Discharge Condition: Stable Ambulatory Status: Ambulatory Discharge Destination: Home Transportation: Private Auto Schedule Follow-up Appointment: No Clinical Summary of Care: Electronic Signature(s) Signed: 05/05/2018 4:46:50 PM By: Renne Crigler Entered By: Renne Crigler on 05/05/2018 15:33:41 Kelsey Allen, Kelsey Allen ([id number]) -------------------------------------------------------------------------------- Lower Extremity Assessment Details Patient Name: Kelsey Allen. Date of Service: 05/05/2018 3:00 PM Medical Record Number: [id number] Patient Account Number: [id number] Date of Birth/Sex: Sep 04, 1970 (47 y.o. F) Treating RN: Curtis Sites Primary Care Halden Phegley: Marisue Ivan Other Clinician: Referring Lucielle Vokes: Marisue Ivan Treating Velton Roselle/Extender: Linwood Dibbles, HOYT Weeks in Treatment: 3 Vascular Assessment Pulses: Dorsalis Pedis Palpable: [Left:Yes] Posterior Tibial Extremity colors, hair growth, and conditions: Extremity Color: [Left:Normal] Hair Growth on Extremity: [Left:Yes] Temperature of Extremity: [Left:Warm] Capillary Refill: [Left:< 3 seconds] Toe Nail Assessment Left: Right: Thick: Yes Discolored: No Deformed: No Improper Length and Hygiene: Yes Electronic Signature(s) Signed: 05/05/2018 5:10:46 PM By: Curtis Sites Entered By: Curtis Sites on 05/05/2018 15:10:23 Kelsey Allen, Kelsey M. ([id number]) -------------------------------------------------------------------------------- Multi Wound Chart Details Patient Name: Kelsey Allen. Date of Service: 05/05/2018 3:00 PM Medical Record Number: [id number] Patient Account Number: [id number] Date of Birth/Sex: Apr 03, 1971 (47 y.o. F) Treating RN: Renne Crigler Primary Care Lyndell Gillyard: Marisue Ivan Other Clinician: Referring Serenna Deroy: Marisue Ivan Treating Leelynd Maldonado/Extender: Linwood Dibbles, HOYT Weeks in Treatment: 3 Vital Signs Height(in): 59 Pulse(bpm): 107 Weight(lbs): 82 Blood Pressure(mmHg): 181/101 Body Mass Index(BMI): 17 Temperature(F): 97.9 Respiratory Rate 18 (breaths/min): Photos: [1:No Photos] [N/A:N/A] Wound Location: [1:Left Lower Leg - Anterior] [N/A:N/A] Wounding Event: [1:Trauma] [N/A:N/A] Primary Etiology: [1:Diabetic Wound/Ulcer of the Lower Extremity] [N/A:N/A] Comorbid History: [1:Hypertension, Type II Diabetes, End Stage Renal Disease, Neuropathy] [N/A:N/A] Date Acquired: [1:12/13/2017] [N/A:N/A] Weeks of Treatment: [1:3] [N/A:N/A] Wound Status: [1:Open] [N/A:N/A] Measurements L x W  x D [1:0.1x0.1x0.1] [N/A:N/A] (cm) Area (cm) : [1:0.008] [N/A:N/A] Volume (cm) : [1:0.001] [N/A:N/A] % Reduction in Area: [1:98.70%] [N/A:N/A] % Reduction in Volume: [1:98.40%] [N/A:N/A] Classification: [1:Grade 1] [N/A:N/A] Exudate Amount: [1:None Present] [N/A:N/A] Wound Margin: [1:Flat and Intact] [N/A:N/A] Granulation Amount: [1:None Present (0%)] [N/A:N/A] Necrotic Amount: [1:None Present (0%)] [N/A:N/A] Exposed Structures: [1:Fat Layer (Subcutaneous Tissue) Exposed: Yes Fascia: No Tendon: No Muscle: No Joint: No Bone: No] [N/A:N/A] Epithelialization: [1:Large (67-100%)] [N/A:N/A] Periwound Skin Texture: [1:Excoriation: No Induration: No Callus: No Crepitus: No Rash: No Scarring: No] [N/A:N/A] Periwound Skin Moisture: [N/A:N/A] Maceration: Yes Dry/Scaly: No Periwound Skin Color: Ecchymosis: Yes N/A N/A Atrophie Blanche: No Cyanosis: No Erythema: No Hemosiderin Staining: No Mottled: No Pallor: No Rubor: No Temperature: No Abnormality N/A N/A Tenderness on Palpation: No N/A N/A Wound Preparation: Ulcer Cleansing: N/A N/A Rinsed/Irrigated with Saline Topical Anesthetic Applied: None Treatment Notes Electronic Signature(s) Signed:  05/05/2018 4:46:50 PM By: Renne Crigler Entered By: Renne Crigler on 05/05/2018 15:26:38 Kelsey Allen ([id number]) -------------------------------------------------------------------------------- Multi-Disciplinary Care Plan Details Patient Name: Kelsey Allen. Date of Service: 05/05/2018 3:00 PM Medical Record Number: [id number] Patient Account Number: [id number] Date of Birth/Sex: 01/23/1971 (47 y.o. F) Treating RN: Renne Crigler Primary Care Ansley Stanwood: Marisue Ivan Other Clinician: Referring Icelynn Onken: Marisue Ivan Treating Videl Nobrega/Extender: Linwood Dibbles, HOYT Weeks in Treatment: 3 Active Inactive Electronic Signature(s) Signed: 05/05/2018 4:46:50 PM By: Renne Crigler Entered By: Renne Crigler on 05/05/2018 15:34:30 Kelsey Allen ([id number]) -------------------------------------------------------------------------------- Pain Assessment Details Patient Name: Kelsey Allen. Date of Service: 05/05/2018 3:00 PM Medical Record Number: [id number] Patient Account Number: [id number] Date of Birth/Sex: 01/22/1971 (47 y.o. F) Treating RN: Renne Crigler Primary Care Vannie Hochstetler: Marisue Ivan Other Clinician: Referring Glade Strausser: Marisue Ivan Treating Ygnacio Fecteau/Extender: Linwood Dibbles, HOYT Weeks in Treatment: 3 Active Problems Location of Pain Severity and Description of Pain Patient Has Paino No Site Locations Pain Management and Medication Current Pain Management: Electronic Signature(s) Signed: 05/05/2018 4:01:29 PM By: Dayton Martes RCP, RRT, CHT Signed: 05/05/2018 4:46:50 PM By: Renne Crigler Entered By: Dayton Martes on 05/05/2018 15:04:06 Kelsey Allen ([id number]) -------------------------------------------------------------------------------- Patient/Caregiver Education Details Patient Name: Kelsey Allen. Date of Service: 05/05/2018 3:00 PM Medical Record Number: [id number] Patient Account Number:  [id number] Date of Birth/Gender: 1971-01-19 (47 y.o. F) Treating RN: Renne Crigler Primary Care Physician: Marisue Ivan Other Clinician: Referring Physician: Marisue Ivan Treating Physician/Extender: Skeet Simmer in Treatment: 3 Education Assessment Education Provided To: Patient Education Topics Provided Wound/Skin Impairment: Handouts: Skin Care Do's and Dont's Methods: Explain/Verbal Responses: State content correctly Electronic Signature(s) Signed: 05/05/2018 4:46:50 PM By: Renne Crigler Entered By: Renne Crigler on 05/05/2018 15:33:46 Kelsey Allen, Kelsey M. ([id number]) -------------------------------------------------------------------------------- Wound Assessment Details Patient Name: Kelsey Allen. Date of Service: 05/05/2018 3:00 PM Medical Record Number: [id number] Patient Account Number: [id number] Date of Birth/Sex: March 11, 1971 (47 y.o. F) Treating RN: Renne Crigler Primary Care Breanne Olvera: Marisue Ivan Other Clinician: Referring Jazzalyn Loewenstein: Marisue Ivan Treating Stacyann Mcconaughy/Extender: Linwood Dibbles, HOYT Weeks in Treatment: 3 Wound Status Wound Number: 1 Primary Diabetic Wound/Ulcer of the Lower Extremity Etiology: Wound Location: Left, Anterior Lower Leg Wound Healed - Epithelialized Wounding Event: Trauma Status: Date Acquired: 12/13/2017 Comorbid Hypertension, Type II Diabetes, End Stage Weeks Of Treatment: 3 History: Renal Disease, Neuropathy Clustered Wound: No Wound Measurements Length: (cm) 0 % Redu Width: (cm) 0 % Redu Depth: (cm) 0 Epithe Area: (cm) 0 Tunne Volume: (cm) 0 Under ction in Area: 100% ction in Volume: 100% lialization: Large (67-100%) ling: No mining: No Wound Description Classification: Grade  1 Foul O Wound Margin: Flat and Intact Slough Exudate Amount: None Present dor After Cleansing: No /Fibrino Yes Wound Bed Granulation Amount: None Present (0%) Exposed Structure Necrotic Amount: None Present  (0%) Fascia Exposed: No Fat Layer (Subcutaneous Tissue) Exposed: Yes Tendon Exposed: No Muscle Exposed: No Joint Exposed: No Bone Exposed: No Periwound Skin Texture Texture Color No Abnormalities Noted: No No Abnormalities Noted: No Callus: No Atrophie Blanche: No Crepitus: No Cyanosis: No Excoriation: No Ecchymosis: Yes Induration: No Erythema: No Rash: No Hemosiderin Staining: No Scarring: No Mottled: No Pallor: No Moisture Rubor: No No Abnormalities Noted: No Dry / Scaly: No Temperature / Pain Maceration: Yes Temperature: No Abnormality Wound Preparation Ulcer Cleansing: Rinsed/Irrigated with Saline Kelsey Allen, Kelsey M. ([id number]) Topical Anesthetic Applied: None Treatment Notes Wound #1 (Left, Anterior Lower Leg) Notes coverlet Electronic Signature(s) Signed: 05/05/2018 4:46:50 PM By: Renne Crigler Entered By: Renne Crigler on 05/05/2018 15:34:12 Kelsey Allen, Kelsey M. ([id number]) -------------------------------------------------------------------------------- Vitals Details Patient Name: Kelsey Allen. Date of Service: 05/05/2018 3:00 PM Medical Record Number: [id number] Patient Account Number: [id number] Date of Birth/Sex: 1970-09-09 (47 y.o. F) Treating RN: Renne Crigler Primary Care Atthew Coutant: Marisue Ivan Other Clinician: Referring Audi Conover: Marisue Ivan Treating Severa Jeremiah/Extender: Linwood Dibbles, HOYT Weeks in Treatment: 3 Vital Signs Time Taken: 15:00 Temperature (F): 97.9 Height (in): 59 Pulse (bpm): 107 Weight (lbs): 82 Respiratory Rate (breaths/min): 18 Body Mass Index (BMI): 16.6 Blood Pressure (mmHg): 181/101 Reference Range: 80 - 120 mg / dl Electronic Signature(s) Signed: 05/05/2018 4:01:29 PM By: Dayton Martes RCP, RRT, CHT Entered By: Dayton Martes on 05/05/2018 15:06:33

## 2018-05-25 ENCOUNTER — Ambulatory Visit
Admission: RE | Admit: 2018-05-25 | Discharge: 2018-05-25 | Disposition: A | Discharge status: Home / Self Care | Payer: 59 | Source: Ambulatory Visit | Attending: Obstetrics and Gynecology | Admitting: Obstetrics and Gynecology

## 2018-05-25 DIAGNOSIS — Z1231 Encounter for screening mammogram for malignant neoplasm of breast: Secondary | ICD-10-CM | POA: Insufficient documentation

## 2018-07-04 ENCOUNTER — Other Ambulatory Visit: Payer: Self-pay | Admitting: Nephrology

## 2018-07-04 DIAGNOSIS — Z940 Kidney transplant status: Secondary | ICD-10-CM

## 2018-07-04 DIAGNOSIS — R809 Proteinuria, unspecified: Secondary | ICD-10-CM

## 2018-07-08 ENCOUNTER — Ambulatory Visit
Admission: RE | Admit: 2018-07-08 | Discharge: 2018-07-08 | Disposition: A | Discharge status: Home / Self Care | Payer: Managed Care, Other (non HMO) | Source: Ambulatory Visit | Attending: Nephrology | Admitting: Nephrology

## 2018-07-08 ENCOUNTER — Other Ambulatory Visit: Payer: Self-pay | Admitting: Nephrology

## 2018-07-08 DIAGNOSIS — Z940 Kidney transplant status: Secondary | ICD-10-CM

## 2018-07-08 DIAGNOSIS — R809 Proteinuria, unspecified: Secondary | ICD-10-CM

## 2018-11-16 ENCOUNTER — Other Ambulatory Visit: Payer: Self-pay | Admitting: Internal Medicine

## 2018-11-16 ENCOUNTER — Encounter: Payer: Self-pay | Admitting: Internal Medicine

## 2018-11-16 DIAGNOSIS — N183 Chronic kidney disease, stage 3 unspecified: Secondary | ICD-10-CM

## 2018-11-16 DIAGNOSIS — N179 Acute kidney failure, unspecified: Secondary | ICD-10-CM | POA: Insufficient documentation

## 2018-11-16 DIAGNOSIS — J690 Pneumonitis due to inhalation of food and vomit: Secondary | ICD-10-CM

## 2018-11-16 DIAGNOSIS — A419 Sepsis, unspecified organism: Secondary | ICD-10-CM

## 2018-11-16 DIAGNOSIS — N189 Chronic kidney disease, unspecified: Secondary | ICD-10-CM

## 2018-11-16 DIAGNOSIS — R6520 Severe sepsis without septic shock: Secondary | ICD-10-CM

## 2018-11-16 DIAGNOSIS — B259 Cytomegaloviral disease, unspecified: Secondary | ICD-10-CM

## 2018-11-16 DIAGNOSIS — N170 Acute kidney failure with tubular necrosis: Secondary | ICD-10-CM

## 2018-11-16 DIAGNOSIS — J9621 Acute and chronic respiratory failure with hypoxia: Secondary | ICD-10-CM | POA: Insufficient documentation

## 2018-11-16 MED ORDER — BROMHIST-NR PO
10.00 | ORAL | Status: DC
Start: 2018-11-15 — End: 2018-11-16

## 2018-11-16 MED ORDER — GLUCAGON HCL RDNA (DIAGNOSTIC) 1 MG IJ SOLR
1.00 | INTRAMUSCULAR | Status: DC
Start: ? — End: 2018-11-16

## 2018-11-16 MED ORDER — DEXTROSE 50 % IV SOLN
12.50 | INTRAVENOUS | Status: DC
Start: ? — End: 2018-11-16

## 2018-11-16 MED ORDER — BROMHIST-NR PO
8.00 | ORAL | Status: DC
Start: 2018-11-15 — End: 2018-11-16

## 2018-11-16 MED ORDER — HEPARIN SODIUM (PORCINE) 1000 UNIT/ML IJ SOLN
INTRAMUSCULAR | Status: DC
Start: ? — End: 2018-11-16

## 2018-11-16 MED ORDER — GENERIC EXTERNAL MEDICATION
125.00 | Status: DC
Start: ? — End: 2018-11-16

## 2018-11-16 MED ORDER — CELLULOSE SODIUM PHOSPHATE VI
2500.00 | Status: DC
Start: 2018-11-14 — End: 2018-11-16

## 2018-11-16 MED ORDER — EPOETIN ALFA-EPBX 2000 UNIT/ML IJ SOLN
2000.00 | INTRAMUSCULAR | Status: DC
Start: ? — End: 2018-11-16

## 2018-11-16 MED ORDER — PRONUTRA PO POWD
450.00 | ORAL | Status: DC
Start: 2018-11-15 — End: 2018-11-16

## 2018-11-16 MED ORDER — DEXTROSE 10 % IV SOLN
50.00 | INTRAVENOUS | Status: DC
Start: ? — End: 2018-11-16

## 2018-11-16 MED ORDER — LOMA ARTHRITIS PO LIQD
5.00 | ORAL | Status: DC
Start: 2018-11-15 — End: 2018-11-16

## 2018-11-16 MED ORDER — ACETAMINOPHEN 325 MG PO TABS
650.00 | ORAL_TABLET | ORAL | Status: DC
Start: ? — End: 2018-11-16

## 2018-11-16 MED ORDER — SUBDUE PLUS PO LIQD
75.00 | ORAL | Status: DC
Start: 2018-11-15 — End: 2018-11-16

## 2018-11-16 MED ORDER — BROMHIST-NR PO
0.00 | ORAL | Status: DC
Start: 2018-11-14 — End: 2018-11-16

## 2018-11-16 MED ORDER — GENERIC EXTERNAL MEDICATION
2.00 | Status: DC
Start: ? — End: 2018-11-16

## 2018-11-16 MED ORDER — BAND-AID MOLESKIN ADHESIVE PADS
1000.00 | MEDICATED_PAD | Status: DC
Start: 2018-11-14 — End: 2018-11-16

## 2018-11-16 MED ORDER — MELATONIN 3 MG PO TABS
3.00 | ORAL_TABLET | ORAL | Status: DC
Start: 2018-11-14 — End: 2018-11-16

## 2018-11-16 MED ORDER — ECHINACEA/GOLDEN SEAL PO CAPS
10.00 | ORAL_CAPSULE | ORAL | Status: DC
Start: 2018-11-15 — End: 2018-11-16

## 2018-11-16 MED ORDER — BROMHIST-NR PO
8.00 | ORAL | Status: DC
Start: 2018-11-14 — End: 2018-11-16

## 2018-11-16 MED ORDER — INSULIN REGULAR HUMAN 100 UNIT/ML IJ SOLN
10.00 | INTRAMUSCULAR | Status: DC
Start: 2018-11-15 — End: 2018-11-16

## 2018-11-16 MED ORDER — NEXTERONE IV
81.00 | INTRAVENOUS | Status: DC
Start: 2018-11-15 — End: 2018-11-16

## 2018-11-16 MED ORDER — SERTRALINE HCL 50 MG PO TABS
100.00 | ORAL_TABLET | ORAL | Status: DC
Start: 2018-11-14 — End: 2018-11-16

## 2018-11-16 NOTE — Progress Notes (Signed)
[facility]  [facility] PULMONARY SERVICE  Date of Service: 11/16/2018  PULMONARY CONSULT   Kelsey Allen  XIH:[id number]  DOB: 1971-06-25     Referring Physician: Larena Glassman, MD  HPI: Kelsey Allen is a 48 y.o. female seen for Acute on Chronic Respiratory Failure.  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  Patient had been having high fevers and concern was raised at that time of COVID-19 infection.  Patient had to be acutely transferred to the intensive care unit at [facility].  Subsequently she was intubated placed on the mechanical ventilator.  Was found to have pneumonia which was treated with antibiotics.  She did not come off of the ventilator easily so therefore ended up having to have a tracheostomy.  Patient now presents back to our facility for further management and weaning she currently is on T collar and has been on 28% FiO2  Review of Systems:  ROS performed and is unremarkable other than noted above.  Past Medical History:  Diagnosis Date  . Diabetes mellitus without complication (HCC)   . Hypertension     Past Surgical History:  Procedure Laterality Date  . HEART TRANSPLANT  2009  . NEPHRECTOMY TRANSPLANTED ORGAN  2009    Social History:    reports that she has never smoked. She has never used smokeless tobacco. She reports that she does not drink alcohol. No history on file for drug.  Family History: Non-Contributory to the present illness  Allergies  Reviewed on the [facility]  Medications: Reviewed on Rounds  Physical Exam:  Vitals: Temperature 99.1 pulse 93 respiratory rate 16 blood pressure 134/78 saturations 95%  Ventilator Settings currently off the ventilator on T collar has been on 28% FiO2  . General: Comfortable at  this time . Eyes: Grossly normal lids, irises & conjunctiva . ENT: grossly tongue is normal . Neck: no obvious mass . Cardiovascular: S1-S2 normal no gallop or rub . Respiratory: No rhonchi or rales are noted at this time . Abdomen: Soft and nontender . Skin: no rash seen on limited exam . Musculoskeletal: not rigid . Psychiatric:unable to assess . Neurologic: no seizure no involuntary movements         Labs on Admission:  Sodium 132 potassium 3.5 BUN 22 creatinine 1.4 White count 17.6 hemoglobin 9.0 platelet count 335 hematocrit 26.6  Radiological Exams on Admission: Chest x-ray showing improving infiltrate  Assessment/Plan Patient Active Problem List   Diagnosis Date Noted  . Acute on chronic respiratory failure with hypoxia (HCC)   . Aspiration pneumonia due to gastric secretions (HCC)   . Severe sepsis (HCC)   . Cytomegalovirus (CMV) viremia (HCC)   . Acute renal failure superimposed on chronic kidney disease (HCC)      1. Acute on chronic respiratory failure with hypoxia patient now has tracheostomy and once again the patient is actually doing fairly well we will go ahead and downsize her to a #4 trach with the plans to possibly decannulate. 2. Pneumonia due to aspiration this was treated with antibiotics is resolved we will continue to monitor radiologically as necessary 3. Severe sepsis resolved now is hemodynamically stable we will continue with supportive care. 4. Acute renal failure tubular necrosis on chronic kidney disease patient's labs are back at baseline as noted above continue  to monitor hydration status patient has history of renal transplant in 2009 5. CMV viremia patient was treated with ganciclovir [facility] supportive [facility] monitor  I have personally seen and evaluated the patient, evaluated laboratory and imaging results, formulated the assessment and plan and placed orders. The Patient requires high complexity decision making for  assessment and support.  Case was discussed on Rounds with the Respiratory Therapy Staff Time Spent 70minutes  Yevonne PaxSaadat A Alanta Scobey, MD [facility] Pulmonary Critical Care Medicine [facility]

## 2018-11-17 ENCOUNTER — Other Ambulatory Visit: Payer: Self-pay | Admitting: Internal Medicine

## 2018-11-17 DIAGNOSIS — N170 Acute kidney failure with tubular necrosis: Secondary | ICD-10-CM

## 2018-11-17 DIAGNOSIS — B259 Cytomegaloviral disease, unspecified: Secondary | ICD-10-CM

## 2018-11-17 DIAGNOSIS — N183 Chronic kidney disease, stage 3 unspecified: Secondary | ICD-10-CM

## 2018-11-17 DIAGNOSIS — J690 Pneumonitis due to inhalation of food and vomit: Secondary | ICD-10-CM

## 2018-11-17 DIAGNOSIS — A419 Sepsis, unspecified organism: Secondary | ICD-10-CM

## 2018-11-17 DIAGNOSIS — R6520 Severe sepsis without septic shock: Secondary | ICD-10-CM

## 2018-11-17 DIAGNOSIS — J9621 Acute and chronic respiratory failure with hypoxia: Secondary | ICD-10-CM

## 2018-11-17 NOTE — Progress Notes (Signed)
[facility]  PROGRESS NOTE  PULMONARY SERVICE ROUNDS  Date of Service: 11/17/2018  Kelsey Allen  DOB: September 22, 1970  Referring physician: Larena Glassman, MD  HPI: Kelsey Allen is a 48 y.o. female  being seen for Acute on Chronic Respiratory Failure.  Patient is on T collar currently on 28% FiO2 saturations are excellent  Review of Systems: Unremarkable other than noted in HPI  Allergies:  Reviewed on the [facility]  Medications: Reviewed  Vitals: Temperature 100.6 pulse 118 respiratory 16 blood pressure 144/84 saturations 99%  Ventilator Settings: Off the ventilator on 28% FiO2  Physical Exam: . General:  calm and comfortable NAD . Eyes: normal lids, irises & conjunctiva . ENT: grossly normal tongue not enlarged . Neck: no masses . Cardiovascular: S1 S2 Normal no rubs no gallop . Respiratory: No rhonchi or rales are noted at this time . Abdomen: soft non-distended . Skin: no rash seen on limited exam . Musculoskeletal:  no rigidity . Psychiatric: unable to assess . Neurologic: no involuntary movements          Lab Data and radiological Data:  AP Portable Chest  History: 48 years old Female with pneumonia.  Comparison: 11/03/2018  Technique: An AP view of the chest was obtained portably. 8:10 PM  Findings: Heart size is at the upper limits of normal. There are postsurgical changes from median sternotomy. Tracheostomy tube overlies the airway. There is a large bore central venous catheter which appears unchanged. There are airspace opacities of the lower lungs bilaterally which have apparently worsened as compared to prior exam. There are air bronchograms of the right lower lung. No pneumothorax is seen. No acute osseous abnormalities are identified.  Impression:  1. Worsening bilateral airspace opacities concerning for diffuse infection or atypical edema.  Electronically Signed by: Arvid Right, MD, [facility] Radiology Electronically Signed  on: 11/16/2018 8:35 PM   Assessment/Plan  Patient Active Problem List   Diagnosis Date Noted  . Acute on chronic respiratory failure with hypoxia (HCC)   . Aspiration pneumonia due to gastric secretions (HCC)   . Severe sepsis (HCC)   . Cytomegalovirus (CMV) viremia (HCC)   . Acute renal failure superimposed on chronic kidney disease (HCC)       1. Acute on chronic respiratory failure hypoxia continue with T collar chest x-ray was done which revealed some increased density on the left side spoke with the primary care team again to get a CT scan of the chest 2. Aspiration pneumonia as above we will get a CT scan of the chest to better assess the left lung. 3. Severe sepsis resolved 4. CMV viremia treated 5. Acute renal failure follow-up on labs   I have personally evaluated the patient, evaluated the laboratory and imaging results and formulated the assessment and plan and placed orders as needed.  Time 35 minutes patient has acute change in status The Patient requires high complexity decision making for assessment and support. I have discussed the patient on rounds with the Respiratory Staff   Yevonne Pax, MD [facility] Pulmonary Critical Care Medicine

## 2018-11-17 NOTE — Progress Notes (Signed)
[facility] ONLY

## 2018-11-18 ENCOUNTER — Other Ambulatory Visit: Payer: Self-pay | Admitting: Internal Medicine

## 2018-11-18 DIAGNOSIS — B259 Cytomegaloviral disease, unspecified: Secondary | ICD-10-CM

## 2018-11-18 DIAGNOSIS — N183 Chronic kidney disease, stage 3 unspecified: Secondary | ICD-10-CM

## 2018-11-18 DIAGNOSIS — R6520 Severe sepsis without septic shock: Secondary | ICD-10-CM

## 2018-11-18 DIAGNOSIS — J9621 Acute and chronic respiratory failure with hypoxia: Secondary | ICD-10-CM

## 2018-11-18 DIAGNOSIS — N170 Acute kidney failure with tubular necrosis: Secondary | ICD-10-CM

## 2018-11-18 DIAGNOSIS — J90 Pleural effusion, not elsewhere classified: Secondary | ICD-10-CM

## 2018-11-18 DIAGNOSIS — J690 Pneumonitis due to inhalation of food and vomit: Secondary | ICD-10-CM

## 2018-11-18 DIAGNOSIS — A419 Sepsis, unspecified organism: Secondary | ICD-10-CM

## 2018-11-18 NOTE — Progress Notes (Signed)
[facility]  PROGRESS NOTE  PULMONARY SERVICE ROUNDS  Date of Service: 11/18/2018  DIAMOND BUYS  DOB: [birth year]  Referring physician: Larena Glassman, MD  HPI: DURGA MINO is a 48 y.o. female  being seen for Acute on Chronic Respiratory Failure.  Patient continues to have a fever had a CT scan done which shows probable pleural effusion along with the consolidation in the lower lobes.  Patient's white count is also 23.7 which is improving  Review of Systems: Unremarkable other than noted in HPI  Allergies:  Reviewed on the [facility]  Medications: Reviewed  Vitals: Temperature one 1.8 pulse 126 respiratory rate 22 blood pressure 166/106 saturations were 95%  Ventilator Settings: Currently off the ventilator on T collar  Physical Exam: . General:  calm and comfortable NAD . Eyes: normal lids, irises & conjunctiva . ENT: grossly normal tongue not enlarged . Neck: no masses . Cardiovascular: S1 S2 Normal no rubs no gallop . Respiratory: No rhonchi or rales are noted at this time . Abdomen: soft non-distended . Skin: no rash seen on limited exam . Musculoskeletal:  no rigidity . Psychiatric: unable to assess . Neurologic: no involuntary movements          Lab Data and radiological Data:  HISTORY: Left sided pleural effusion and pneumonia.Marland Kitchen  PROCEDURE: NON-CONTRAST CT OF THE CHEST  Dose reduction was obtained with Automatic Exposure Control (AEC) or, if AEC could not be utilized, by manual adjustment of the mA and/or kV according to patient size MIPS 362: DICOM format imaged data available to none affiliated external healthcare facilities or entities on a secure, immediately free, reciprocally syncopal basis with patient authorization for at least a 12 month period after the study.  COMPARISONS: CT scan 10/26/2018.  FINDINGS:  Tracheostomy tube tip is well above carina. The trachea distal to the tracheostomy, as well as right and left mainstem  bronchi, are patent. There has been substantial interval improvement in the bilateral groundglass/centrilobular opacities which were demonstrated 10/26/2018. The groundglass opacities which were previously noted appear to have essentially resolved. There is new somewhat reticular density in the right upper lobe posterolaterally, which may at least in part relate to atelectasis. Atelectasis and consolidation are noted within the lower lobes bilaterally, having progressed. Air bronchograms are demonstrated in both lower lobes. There is increased compressive atelectasis bilaterally due to enlarged bilateral pleural effusions. The effusion on the right measures approximately 2.8 cm and on the left approximately 2.5 cm in depth. Within the limits of noncontrast technique, there is no definite mediastinal or hilar adenopathy identified. Overall heart size is grossly stable. No pericardial effusion demonstrated. A right IJ central line terminates within the right atrium. Imaged portions of the upper abdomen demonstrate no acute appearing abnormality. Renal density appears similar to previous, in the imaged region.   IMPRESSION:  1.   Marked interval improvement in the predominantly groundglass and centrilobular opacities that were demonstrated 10/26/2018. 2. Increased consolidation and volume loss in the lower lobes, which may relate to compressive atelectasis due to enlarged pleural effusion bilaterally. Pneumonia cannot be excluded. 3. Please see above additional comments   Assessment/Plan  Patient Active Problem List   Diagnosis Date Noted  . Acute on chronic respiratory failure with hypoxia (HCC)   . Aspiration pneumonia due to gastric secretions (HCC)   . Severe sepsis (HCC)   . Cytomegalovirus (CMV) viremia (HCC)   . Acute renal failure superimposed on chronic kidney disease (HCC)  1. Acute on chronic respiratory failure with hypoxia patient is off the ventilator on T collar  and is doing fairly well however continues to have fevers. 2. Aspiration pneumonia there has been improvement on the CT scan however there is some lower lobe volume loss along with some compressive atelectasis secondary to the pleural effusions.  Spoke with primary care team regarding possibility of doing diagnostic thoracentesis. 3. Severe sepsis resolved we will continue with supportive care hemodynamically stable 4. CMV treated we will continue with present management. 5. Acute renal failure on chronic disease we will continue with supportive care prognosis is guarded   I have personally evaluated the patient, evaluated the laboratory and imaging results and formulated the assessment and plan and placed orders as needed. The Patient requires high complexity decision making for assessment and support. I have discussed the patient on rounds with the Respiratory Staff   Yevonne PaxSaadat A Roshaun Pound, MD [facility] Pulmonary Critical Care Medicine

## 2018-11-19 ENCOUNTER — Other Ambulatory Visit: Payer: Self-pay | Admitting: Internal Medicine

## 2018-11-19 DIAGNOSIS — N170 Acute kidney failure with tubular necrosis: Secondary | ICD-10-CM

## 2018-11-19 DIAGNOSIS — A419 Sepsis, unspecified organism: Secondary | ICD-10-CM

## 2018-11-19 DIAGNOSIS — J690 Pneumonitis due to inhalation of food and vomit: Secondary | ICD-10-CM

## 2018-11-19 DIAGNOSIS — R6520 Severe sepsis without septic shock: Secondary | ICD-10-CM

## 2018-11-19 DIAGNOSIS — N183 Chronic kidney disease, stage 3 unspecified: Secondary | ICD-10-CM

## 2018-11-19 DIAGNOSIS — B259 Cytomegaloviral disease, unspecified: Secondary | ICD-10-CM

## 2018-11-19 DIAGNOSIS — J9621 Acute and chronic respiratory failure with hypoxia: Secondary | ICD-10-CM

## 2018-11-20 NOTE — Progress Notes (Signed)
[facility]  PROGRESS NOTE  PULMONARY SERVICE ROUNDS  Date of Service: 11/19/2018  Kelsey Allen  DOB: [birth year]  Referring physician: Larena Glassman, MD  HPI: Kelsey Allen is a 48 y.o. female  being seen for Acute on Chronic Respiratory Failure.  Patient is comfortable right now without distress still having low-grade temperature noted.  Was 100.3 today  Review of Systems: Unremarkable other than noted in HPI  Allergies:  Reviewed on the [facility]  Medications: Reviewed  Vitals: Temperature 100.3 pulse 117 respiratory rate 20 blood pressure 142/76 saturations 97%  Ventilator Settings: Off the ventilator on T collar  Physical Exam: . General:  calm and comfortable NAD . Eyes: normal lids, irises & conjunctiva . ENT: grossly normal tongue not enlarged . Neck: no masses . Cardiovascular: S1 S2 Normal no rubs no gallop . Respiratory: No rhonchi or rales are noted at this time . Abdomen: soft non-distended . Skin: no rash seen on limited exam . Musculoskeletal:  no rigidity . Psychiatric: unable to assess . Neurologic: no involuntary movements          Lab Data and radiological Data:  Labs were reviewed   Assessment/Plan  Patient Active Problem List   Diagnosis Date Noted  . Acute on chronic respiratory failure with hypoxia (HCC)   . Aspiration pneumonia due to gastric secretions (HCC)   . Severe sepsis (HCC)   . Cytomegalovirus (CMV) viremia (HCC)   . Acute renal failure superimposed on chronic kidney disease (HCC)       1. Acute on chronic respiratory failure with hypoxia we will continue with the T collar for now.  Continue aggressive pulmonary toilet supportive care. 2. Aspiration pneumonia treated we will continue to follow along. 3. Severe sepsis hemodynamically stable 4. CMV viremia treated we will continue to follow 5. Acute renal failure follow labs closely patient is being also followed by nephrology   I have personally evaluated the  patient, evaluated the laboratory and imaging results and formulated the assessment and plan and placed orders as needed. The Patient requires high complexity decision making for assessment and support. I have discussed the patient on rounds with the Respiratory Staff   Yevonne Pax, MD [facility] Pulmonary Critical Care Medicine

## 2018-11-21 ENCOUNTER — Other Ambulatory Visit: Payer: Self-pay | Admitting: Internal Medicine

## 2018-11-21 DIAGNOSIS — N183 Chronic kidney disease, stage 3 unspecified: Secondary | ICD-10-CM

## 2018-11-21 DIAGNOSIS — N170 Acute kidney failure with tubular necrosis: Secondary | ICD-10-CM

## 2018-11-21 DIAGNOSIS — R6520 Severe sepsis without septic shock: Secondary | ICD-10-CM

## 2018-11-21 DIAGNOSIS — A419 Sepsis, unspecified organism: Secondary | ICD-10-CM

## 2018-11-21 DIAGNOSIS — J9621 Acute and chronic respiratory failure with hypoxia: Secondary | ICD-10-CM

## 2018-11-21 DIAGNOSIS — J690 Pneumonitis due to inhalation of food and vomit: Secondary | ICD-10-CM

## 2018-11-21 DIAGNOSIS — B259 Cytomegaloviral disease, unspecified: Secondary | ICD-10-CM

## 2018-11-21 NOTE — Progress Notes (Signed)
[facility]  PROGRESS NOTE  PULMONARY SERVICE ROUNDS  Date of Service: 11/21/2018  Kelsey Allen  DOB: 1970-12-01  Referring physician: Larena Glassman, MD  HPI: Kelsey Allen is a 48 y.o. female  being seen for Acute on Chronic Respiratory Failure.  Patient is comfortable right now without distress remains on T collar has been on 28% FiO2 she has no specific complaints today thoracentesis has not yet been done  Review of Systems: Unremarkable other than noted in HPI  Allergies:  Reviewed on the [facility]  Medications: Reviewed  Vitals: Temperature 97.7 pulse 103 respiratory 18 blood pressure 138/88 saturations 97%  Ventilator Settings: Off ventilator on T collar FiO2 28%  Physical Exam: . General:  calm and comfortable NAD . Eyes: normal lids, irises & conjunctiva . ENT: grossly normal tongue not enlarged . Neck: no masses . Cardiovascular: S1 S2 Normal no rubs no gallop . Respiratory: No rhonchi or rales are noted at this time . Abdomen: soft non-distended . Skin: no rash seen on limited exam . Musculoskeletal:  no rigidity . Psychiatric: unable to assess . Neurologic: no involuntary movements          Lab Data and radiological Data:  Sodium 134 potassium 4.1 BUN 97 creatinine 3.0 White count 14.7 hemoglobin 7.4 hematocrit 22.7 platelet count 295   Assessment/Plan  Patient Active Problem List   Diagnosis Date Noted  . Acute on chronic respiratory failure with hypoxia (HCC)   . Aspiration pneumonia due to gastric secretions (HCC)   . Severe sepsis (HCC)   . Cytomegalovirus (CMV) viremia (HCC)   . Acute renal failure superimposed on chronic kidney disease (HCC)       1. Acute on chronic respiratory failure with hypoxia we will continue with T collar trials titrate oxygen as tolerated await possible thoracentesis 2. Aspiration pneumonia treated sputum has pleural effusion which is awaiting tap 3. Severe sepsis resolved we will continue with  supportive care 4. Cytomegalovirus viremia treated 5. Acute renal failure continue with supportive care   I have personally evaluated the patient, evaluated the laboratory and imaging results and formulated the assessment and plan and placed orders as needed. The Patient requires high complexity decision making for assessment and support. I have discussed the patient on rounds with the Respiratory Staff   Yevonne Pax, MD [facility] Pulmonary Critical Care Medicine

## 2018-11-22 ENCOUNTER — Other Ambulatory Visit: Payer: Self-pay | Admitting: Internal Medicine

## 2018-11-22 DIAGNOSIS — J690 Pneumonitis due to inhalation of food and vomit: Secondary | ICD-10-CM

## 2018-11-22 DIAGNOSIS — R6520 Severe sepsis without septic shock: Secondary | ICD-10-CM

## 2018-11-22 DIAGNOSIS — N170 Acute kidney failure with tubular necrosis: Secondary | ICD-10-CM

## 2018-11-22 DIAGNOSIS — J9621 Acute and chronic respiratory failure with hypoxia: Secondary | ICD-10-CM

## 2018-11-22 DIAGNOSIS — N183 Chronic kidney disease, stage 3 (moderate): Secondary | ICD-10-CM

## 2018-11-22 DIAGNOSIS — B259 Cytomegaloviral disease, unspecified: Secondary | ICD-10-CM

## 2018-11-22 DIAGNOSIS — A419 Sepsis, unspecified organism: Secondary | ICD-10-CM

## 2018-11-22 NOTE — Progress Notes (Signed)
[facility]  PROGRESS NOTE  PULMONARY SERVICE ROUNDS  Date of Service: 11/22/2018  Kelsey Allen  DOB: 1970-08-26  Referring physician: Larena Glassman, MD  HPI: Kelsey Allen is a 48 y.o. female  being seen for Acute on Chronic Respiratory Failure.  Patient currently is on T collar has been on 28% FiO2  Review of Systems: Unremarkable other than noted in HPI  Allergies:  Reviewed on the [facility]  Medications: Reviewed  Vitals: Temperature 98.4 pulse 99 respiratory rate 16 blood pressure 124/84 saturations 99%  Ventilator Settings: On T collar right now 28% FiO2  Physical Exam: . General:  calm and comfortable NAD . Eyes: normal lids, irises & conjunctiva . ENT: grossly normal tongue not enlarged . Neck: no masses . Cardiovascular: S1 S2 Normal no rubs no gallop . Respiratory: No rhonchi or rales are noted at this time . Abdomen: soft non-distended . Skin: no rash seen on limited exam . Musculoskeletal:  no rigidity . Psychiatric: unable to assess . Neurologic: no involuntary movements          Lab Data and radiological Data:  No labs to report   Assessment/Plan  Patient Active Problem List   Diagnosis Date Noted  . Acute on chronic respiratory failure with hypoxia (HCC)   . Aspiration pneumonia due to gastric secretions (HCC)   . Severe sepsis (HCC)   . Cytomegalovirus (CMV) viremia (HCC)   . Acute renal failure superimposed on chronic kidney disease (HCC)       1. Acute on chronic respiratory failure with hypoxia we will continue with T collar trials continue secretion management pulmonary toilet 2. Aspiration pneumonia treated we will continue to follow 3. Severe sepsis hemodynamically stable 4. CMV treated 5. Follow-up labs   I have personally evaluated the patient, evaluated the laboratory and imaging results and formulated the assessment and plan and placed orders as needed. The Patient requires high complexity decision making for  assessment and support. I have discussed the patient on rounds with the Respiratory Staff   Yevonne Pax, MD [facility] Pulmonary Critical Care Medicine

## 2018-11-23 ENCOUNTER — Other Ambulatory Visit: Payer: Self-pay | Admitting: Internal Medicine

## 2018-11-23 DIAGNOSIS — N183 Chronic kidney disease, stage 3 (moderate): Secondary | ICD-10-CM

## 2018-11-23 DIAGNOSIS — R6520 Severe sepsis without septic shock: Secondary | ICD-10-CM

## 2018-11-23 DIAGNOSIS — B259 Cytomegaloviral disease, unspecified: Secondary | ICD-10-CM

## 2018-11-23 DIAGNOSIS — A419 Sepsis, unspecified organism: Secondary | ICD-10-CM

## 2018-11-23 DIAGNOSIS — N170 Acute kidney failure with tubular necrosis: Secondary | ICD-10-CM

## 2018-11-23 DIAGNOSIS — J690 Pneumonitis due to inhalation of food and vomit: Secondary | ICD-10-CM

## 2018-11-23 DIAGNOSIS — J9621 Acute and chronic respiratory failure with hypoxia: Secondary | ICD-10-CM

## 2018-11-23 NOTE — Progress Notes (Signed)
[facility]  PROGRESS NOTE  PULMONARY SERVICE ROUNDS  Date of Service: 11/23/2018  Kelsey Allen  DOB: 12-02-1970  Referring physician: Larena Glassman, MD  HPI: Kelsey Allen is a 48 y.o. female  being seen for Acute on Chronic Respiratory Failure.  Patient is on T collar right now has been tolerating PMV  Review of Systems: Unremarkable other than noted in HPI  Allergies:  Reviewed on the [facility]  Medications: Reviewed  Vitals: Temperature 97.9 pulse 103 respiratory 18 blood pressure 122/72 saturations 100%  Ventilator Settings: Off the ventilator on T collar with PMV in place  Physical Exam: . General:  calm and comfortable NAD . Eyes: normal lids, irises & conjunctiva . ENT: grossly normal tongue not enlarged . Neck: no masses . Cardiovascular: S1 S2 Normal no rubs no gallop . Respiratory: No rhonchi or rales are noted at this time . Abdomen: soft non-distended . Skin: no rash seen on limited exam . Musculoskeletal:  no rigidity . Psychiatric: unable to assess . Neurologic: no involuntary movements          Lab Data and radiological Data:  Labs have been reviewed   Assessment/Plan  Patient Active Problem List   Diagnosis Date Noted  . Acute on chronic respiratory failure with hypoxia (HCC)   . Aspiration pneumonia due to gastric secretions (HCC)   . Severe sepsis (HCC)   . Cytomegalovirus (CMV) viremia (HCC)   . Acute renal failure superimposed on chronic kidney disease (HCC)       1. Acute on chronic respiratory failure with hypoxia we will continue with T collar trials which the patient has passed so we will advance to capping trials 2. Aspiration pneumonia treated we will continue with supportive care 3. Severe sepsis hemodynamically stable resolved 4. CMV viremia treated 5. Acute renal failure continue to follow labs   I have personally evaluated the patient, evaluated the laboratory and imaging results and formulated the assessment  and plan and placed orders as needed. The Patient requires high complexity decision making for assessment and support. I have discussed the patient on rounds with the Respiratory Staff   Yevonne Pax, MD [facility] Pulmonary Critical Care Medicine

## 2018-11-24 ENCOUNTER — Other Ambulatory Visit: Payer: Self-pay | Admitting: Internal Medicine

## 2018-11-24 DIAGNOSIS — A419 Sepsis, unspecified organism: Secondary | ICD-10-CM

## 2018-11-24 DIAGNOSIS — R6520 Severe sepsis without septic shock: Secondary | ICD-10-CM

## 2018-11-24 DIAGNOSIS — B259 Cytomegaloviral disease, unspecified: Secondary | ICD-10-CM

## 2018-11-24 DIAGNOSIS — N183 Chronic kidney disease, stage 3 unspecified: Secondary | ICD-10-CM

## 2018-11-24 DIAGNOSIS — N170 Acute kidney failure with tubular necrosis: Secondary | ICD-10-CM

## 2018-11-24 DIAGNOSIS — J9621 Acute and chronic respiratory failure with hypoxia: Secondary | ICD-10-CM

## 2018-11-24 DIAGNOSIS — J690 Pneumonitis due to inhalation of food and vomit: Secondary | ICD-10-CM

## 2018-11-24 NOTE — Progress Notes (Signed)
[facility]  PROGRESS NOTE  PULMONARY SERVICE ROUNDS  Date of Service: 11/24/2018  Kelsey Allen  DOB: Aug 16, 1970  Referring physician: Larena Glassman, MD  HPI: Kelsey Allen is a 48 y.o. female  being seen for Acute on Chronic Respiratory Failure.  Patient is capping doing fairly well she underwent thoracentesis yesterday  Review of Systems: Unremarkable other than noted in HPI  Allergies:  Reviewed on the [facility]  Medications: Reviewed  Vitals: Temperature 97.4 pulse 108 respiratory 20 blood pressure 120/70 saturations 100%  Ventilator Settings: Capping trials will be continued  Physical Exam: . General:  calm and comfortable NAD . Eyes: normal lids, irises & conjunctiva . ENT: grossly normal tongue not enlarged . Neck: no masses . Cardiovascular: S1 S2 Normal no rubs no gallop . Respiratory: No rhonchi or rales are noted . Abdomen: soft non-distended . Skin: no rash seen on limited exam . Musculoskeletal:  no rigidity . Psychiatric: unable to assess . Neurologic: no involuntary movements          Lab Data and radiological Data:  Sodium 142 potassium 3.6 BUN 112 creatinine 2.7   Assessment/Plan  Patient Active Problem List   Diagnosis Date Noted  . Acute on chronic respiratory failure with hypoxia (HCC)   . Aspiration pneumonia due to gastric secretions (HCC)   . Severe sepsis (HCC)   . Cytomegalovirus (CMV) viremia (HCC)   . Acute renal failure superimposed on chronic kidney disease (HCC)       1. Acute on chronic respiratory failure with hypoxia continue with the capping trials.  I am eventually going to plan to decannulate in her. 2. Aspiration pneumonia treated we will continue with supportive care 3. Severe sepsis hemodynamically stable 4. CMV viremia treated 5. Acute renal failure followed by nephrology for dialysis   I have personally evaluated the patient, evaluated the laboratory and imaging results and formulated the assessment  and plan and placed orders as needed. The Patient requires high complexity decision making for assessment and support. I have discussed the patient on rounds with the Respiratory Staff   Yevonne Pax, MD [facility] Pulmonary Critical Care Medicine

## 2018-11-25 ENCOUNTER — Other Ambulatory Visit: Payer: Self-pay | Admitting: Internal Medicine

## 2018-11-25 DIAGNOSIS — J9621 Acute and chronic respiratory failure with hypoxia: Secondary | ICD-10-CM

## 2018-11-25 DIAGNOSIS — A419 Sepsis, unspecified organism: Secondary | ICD-10-CM

## 2018-11-25 DIAGNOSIS — N170 Acute kidney failure with tubular necrosis: Secondary | ICD-10-CM

## 2018-11-25 DIAGNOSIS — B259 Cytomegaloviral disease, unspecified: Secondary | ICD-10-CM

## 2018-11-25 DIAGNOSIS — J690 Pneumonitis due to inhalation of food and vomit: Secondary | ICD-10-CM

## 2018-11-25 DIAGNOSIS — N183 Chronic kidney disease, stage 3 (moderate): Secondary | ICD-10-CM

## 2018-11-25 DIAGNOSIS — R6520 Severe sepsis without septic shock: Secondary | ICD-10-CM

## 2018-11-25 NOTE — Progress Notes (Signed)
[facility]  PROGRESS NOTE  PULMONARY SERVICE ROUNDS  Date of Service: 11/25/2018  Kelsey Allen  DOB: August 09, 1970  Referring physician: Larena Glassman, MD  HPI: HELDANA Allen is a 48 y.o. female  being seen for Acute on Chronic Respiratory Failure.  Patient is capping no distress at this time remains on about 1-1/2 L oxygen  Review of Systems: Unremarkable other than noted in HPI  Allergies:  Reviewed on the [facility]  Medications: Reviewed  Vitals: Temperature 97.7 pulse 107 respiratory rate 18 blood pressure 160/90 saturations 100%  Ventilator Settings: Capping off the ventilator  Physical Exam: . General:  calm and comfortable NAD . Eyes: normal lids, irises & conjunctiva . ENT: grossly normal tongue not enlarged . Neck: no masses . Cardiovascular: S1 S2 Normal no rubs no gallop . Respiratory: No rhonchi or rales are noted . Abdomen: soft non-distended . Skin: no rash seen on limited exam . Musculoskeletal:  no rigidity . Psychiatric: unable to assess . Neurologic: no involuntary movements          Lab Data and radiological Data:  Sodium 140 potassium 4.2 BUN 100 creatinine 2.7   Assessment/Plan  Patient Active Problem List   Diagnosis Date Noted  . Acute on chronic respiratory failure with hypoxia (HCC)   . Aspiration pneumonia due to gastric secretions (HCC)   . Severe sepsis (HCC)   . Cytomegalovirus (CMV) viremia (HCC)   . Acute renal failure superimposed on chronic kidney disease (HCC)       1. Acute on chronic respiratory failure with hypoxia continue with capping trials titrate oxygen down as tolerated 2. Aspiration pneumonia treated clinically is improving 3. Severe sepsis resolved 4. Cytomegalovirus viremia treated 5. Acute renal failure patient being followed by nephrology   I have personally evaluated the patient, evaluated the laboratory and imaging results and formulated the assessment and plan and placed orders as  needed. The Patient requires high complexity decision making for assessment and support. I have discussed the patient on rounds with the Respiratory Staff   Yevonne Pax, MD [facility] Pulmonary Critical Care Medicine

## 2018-11-26 ENCOUNTER — Other Ambulatory Visit: Payer: Self-pay | Admitting: Internal Medicine

## 2018-11-26 DIAGNOSIS — J9621 Acute and chronic respiratory failure with hypoxia: Secondary | ICD-10-CM

## 2018-11-26 DIAGNOSIS — B259 Cytomegaloviral disease, unspecified: Secondary | ICD-10-CM

## 2018-11-26 DIAGNOSIS — N183 Chronic kidney disease, stage 3 unspecified: Secondary | ICD-10-CM

## 2018-11-26 DIAGNOSIS — R6520 Severe sepsis without septic shock: Secondary | ICD-10-CM

## 2018-11-26 DIAGNOSIS — A419 Sepsis, unspecified organism: Secondary | ICD-10-CM

## 2018-11-26 DIAGNOSIS — J690 Pneumonitis due to inhalation of food and vomit: Secondary | ICD-10-CM

## 2018-11-26 DIAGNOSIS — N170 Acute kidney failure with tubular necrosis: Secondary | ICD-10-CM

## 2018-11-26 NOTE — Progress Notes (Signed)
[facility]  PROGRESS NOTE  PULMONARY SERVICE ROUNDS  Date of Service: 11/26/2018  Kelsey Allen  DOB: 19-Feb-1971  Referring physician: Larena Glassman, MD  HPI: Kelsey Allen is a 48 y.o. female  being seen for Acute on Chronic Respiratory Failure.  Patient is capping right now on approximately 1.5 L  Review of Systems: Unremarkable other than noted in HPI  Allergies:  Reviewed on the [facility]  Medications: Reviewed  Vitals: Temperature 97.3 pulse 100 respiratory rate 16 blood pressure 110/68 saturations 100%  Ventilator Settings: Capping off the ventilator at this time  Physical Exam: . General:  calm and comfortable NAD . Eyes: normal lids, irises & conjunctiva . ENT: grossly normal tongue not enlarged . Neck: no masses . Cardiovascular: S1 S2 Normal no rubs no gallop . Respiratory: No rhonchi or rales are noted . Abdomen: soft non-distended . Skin: no rash seen on limited exam . Musculoskeletal:  no rigidity . Psychiatric: unable to assess . Neurologic: no involuntary movements          Lab Data and radiological Data:  Data reviewed   Assessment/Plan  Patient Active Problem List   Diagnosis Date Noted  . Acute on chronic respiratory failure with hypoxia (HCC)   . Aspiration pneumonia due to gastric secretions (HCC)   . Severe sepsis (HCC)   . Cytomegalovirus (CMV) viremia (HCC)   . Acute renal failure superimposed on chronic kidney disease (HCC)       1. Acute on chronic respiratory failure with hypoxia we will continue with capping trials working towards decannulation. 2. Aspiration pneumonia treated clinically improved 3. Severe sepsis resolved 4. CMV viremia treated 5. Acute renal failure patient is being followed by nephrology   I have personally evaluated the patient, evaluated the laboratory and imaging results and formulated the assessment and plan and placed orders as needed. The Patient requires high complexity decision making  for assessment and support. I have discussed the patient on rounds with the Respiratory Staff   Yevonne Pax, MD [facility] Pulmonary Critical Care Medicine

## 2018-11-28 ENCOUNTER — Other Ambulatory Visit: Payer: Self-pay | Admitting: Internal Medicine

## 2018-11-28 DIAGNOSIS — R6520 Severe sepsis without septic shock: Secondary | ICD-10-CM

## 2018-11-28 DIAGNOSIS — N170 Acute kidney failure with tubular necrosis: Secondary | ICD-10-CM

## 2018-11-28 DIAGNOSIS — N183 Chronic kidney disease, stage 3 unspecified: Secondary | ICD-10-CM

## 2018-11-28 DIAGNOSIS — J9621 Acute and chronic respiratory failure with hypoxia: Secondary | ICD-10-CM

## 2018-11-28 DIAGNOSIS — J690 Pneumonitis due to inhalation of food and vomit: Secondary | ICD-10-CM

## 2018-11-28 DIAGNOSIS — A419 Sepsis, unspecified organism: Secondary | ICD-10-CM

## 2018-11-28 DIAGNOSIS — B259 Cytomegaloviral disease, unspecified: Secondary | ICD-10-CM

## 2018-11-28 NOTE — Progress Notes (Signed)
[facility]  PROGRESS NOTE  PULMONARY SERVICE ROUNDS  Date of Service: 11/28/2018  Kelsey Allen  DOB: [birth year]  Referring physician: Larena Glassman, MD  HPI: Kelsey Allen is a 48 y.o. female  being seen for Acute on Chronic Respiratory Failure.  Patient is capping on 2 L nasal cannula.  She is currently afebrile her hemoglobin was 6.7 however  Review of Systems: Unremarkable other than noted in HPI  Allergies:  Reviewed on the [facility]  Medications: Reviewed  Vitals: Temperature 96.0 pulse 109 respiratory rate 18 blood pressure 154/80 saturations 100%  Ventilator Settings: Capping at this time on 2 L  Physical Exam: . General:  calm and comfortable NAD . Eyes: normal lids, irises & conjunctiva . ENT: grossly normal tongue not enlarged . Neck: no masses . Cardiovascular: S1 S2 Normal no rubs no gallop . Respiratory: No rhonchi or rales are noted at this time . Abdomen: soft non-distended . Skin: no rash seen on limited exam . Musculoskeletal:  no rigidity . Psychiatric: unable to assess . Neurologic: no involuntary movements          Lab Data and radiological Data:  Sodium 148 potassium 3.3 BUN 126 creatinine 2.5 White count 24.9 hemoglobin 6.7 hematocrit 21 platelet count 472   Assessment/Plan  Patient Active Problem List   Diagnosis Date Noted  . Acute on chronic respiratory failure with hypoxia (HCC)   . Aspiration pneumonia due to gastric secretions (HCC)   . Severe sepsis (HCC)   . Cytomegalovirus (CMV) viremia (HCC)   . Acute renal failure superimposed on chronic kidney disease (HCC)       1. Acute on chronic respiratory failure with hypoxia we will continue with capping hold off on decannulation secondary to the low hemoglobin. 2. Aspiration pneumonia treated we will continue to monitor 3. Severe sepsis resolved hemodynamically stable at this time. 4. CMV viremia treated we will continue present management 5. Acute renal failure on  chronic kidney disease followed by nephrology will monitor recommendations   I have personally evaluated the patient, evaluated the laboratory and imaging results and formulated the assessment and plan and placed orders as needed. The Patient requires high complexity decision making for assessment and support. I have discussed the patient on rounds with the Respiratory Staff   Yevonne Pax, MD [facility] Pulmonary Critical Care Medicine

## 2018-11-29 ENCOUNTER — Other Ambulatory Visit: Payer: Self-pay | Admitting: Internal Medicine

## 2018-11-29 DIAGNOSIS — N183 Chronic kidney disease, stage 3 (moderate): Secondary | ICD-10-CM

## 2018-11-29 DIAGNOSIS — N170 Acute kidney failure with tubular necrosis: Secondary | ICD-10-CM

## 2018-11-29 DIAGNOSIS — B259 Cytomegaloviral disease, unspecified: Secondary | ICD-10-CM

## 2018-11-29 DIAGNOSIS — J690 Pneumonitis due to inhalation of food and vomit: Secondary | ICD-10-CM

## 2018-11-29 DIAGNOSIS — R6520 Severe sepsis without septic shock: Secondary | ICD-10-CM

## 2018-11-29 DIAGNOSIS — J9621 Acute and chronic respiratory failure with hypoxia: Secondary | ICD-10-CM

## 2018-11-29 DIAGNOSIS — J90 Pleural effusion, not elsewhere classified: Secondary | ICD-10-CM

## 2018-11-29 DIAGNOSIS — A419 Sepsis, unspecified organism: Secondary | ICD-10-CM

## 2018-11-29 NOTE — Progress Notes (Signed)
[facility]  PROGRESS NOTE  PULMONARY SERVICE ROUNDS  Date of Service: 11/29/2018  Kelsey Allen  DOB: 03/16/1971  Referring physician: Larena GlassmanAmir Firozvi, MD  HPI: Kelsey Allen is a 48 y.o. female  being seen for Acute on Chronic Respiratory Failure.  Patient is currently capping has been on 2 L.  Chest x-ray was done which shows increased infiltrates.  The patient also has been noted to have increase in her white count.  Her dialysis has been on hold  Review of Systems: Unremarkable other than noted in HPI  All[facility]ies:  [facility]  Medications: Reviewed  Vitals: Temperature 96.0 pulse 105 respiratory rate 20 blood pressure 142/82 saturations 100%  Ventilator Settings: Off the ventilator still capping  Physical Exam: . General:  calm and comfortable NAD . Eyes: normal lids, irises & conjunctiva . ENT: grossly normal tongue not enlarged . Neck: no masses . Cardiovascular: S1 S2 Normal no rubs no gallop . Respiratory: No rhonchi or rales are noted at this time . Abdomen: soft non-distended . Skin: no rash seen on limited exam . Musculoskeletal:  no rigidity . Psychiatric: unable to assess . Neurologic: no involuntary movements          Lab Data and radiological Data:  Sodium 151 potassium 3.7 BUN 20 creatinine 2.4 White count 27.6 hemoglobin 12 hematocrit 36.5 platelet count 437  EXAM: Portable chest AP, 10:45 AM on 11/29/2018  INDICATION: Elevated white blood cell count.  COMPARISON: 11/23/2018 and 11/16/2018  TECHNIQUE: AP portable view of the chest was performed.  FINDINGS: Patchy infiltrates, atelectasis, and consolidation extending from the perihilar regions bilaterally into the lung bases, with minimal change on the left and interval increase in the right since the prior study when allowing for differences in technique. A new more focal zone of involvement has developed in the right midlung.  Cardiac silhouette is partially  obscured, appearing enlarged. Mediastinum remains stable. The hila are partially obscured.  No acute bony abnormality is noted.  Overlying cardiac monitoring leads and median sternotomy wires. Right internal jugular dialysis catheter and tracheostomy tube again noted.   No pneumothorax seen. Probable layering bilateral pleural effusions.  IMPRESSION:  1. Probable layering pleural effusions bilaterally. 2. Interval increase in atelectasis, infiltrates, and consolidation in the right midlung and base. Additional findings elsewhere in the perihilar regions and lower lungs bilaterally are similar to the prior exam, concerning for pneumonia in the appropriate clinical setting.  Electronically Signed by: Sherryll BurgerAndrew Choi, MD, [facility] Electronically Signed on: 11/29/2018 11:25 AM   Assessment/Plan  Patient Active Problem List   Diagnosis Date Noted  . Acute on chronic respiratory failure with hypoxia (HCC)   . Aspiration pneumonia due to gastric secretions (HCC)   . Severe sepsis (HCC)   . Cytomegalovirus (CMV) viremia (HCC)   . Acute renal failure superimposed on chronic kidney disease (HCC)       1. Acute on chronic respiratory failure with hypoxia patient is actually doing still fairly well on 2 L nasal cannula however the chest x-ray does significantly look worse.  I personally reviewed the film.  The possibility is increasing infiltrate versus pneumonia versus pleural effusion.  Spoke with the primary care team we will get a CT scan for better picture of the right lung. 2. Aspiration pneumonia in the past we will continue with supportive care also as mentioned including get follow-up imaging studies. 3. Severe sepsis resolved right now is hemodynamically stable 4. CMV treated we will  continue with supportive care 5. Acute renal failure nephrology is following along right now the dialysis has been on hold 6. Possible pleural effusion CT imaging will be done to further  assess.   I have personally evaluated the patient, evaluated the laboratory and imaging results and formulated the assessment and plan and placed orders as needed.  Time 35 minutes patient has had an acute change in her status and worsening radiological imaging The Patient requires high complexity decision making for assessment and support. I have discussed the patient on rounds with the Respiratory Staff   Yevonne Pax, MD [facility] Pulmonary Critical Care Medicine

## 2018-11-30 ENCOUNTER — Other Ambulatory Visit: Payer: Self-pay | Admitting: Internal Medicine

## 2018-11-30 DIAGNOSIS — N183 Chronic kidney disease, stage 3 unspecified: Secondary | ICD-10-CM

## 2018-11-30 DIAGNOSIS — A419 Sepsis, unspecified organism: Secondary | ICD-10-CM

## 2018-11-30 DIAGNOSIS — R6520 Severe sepsis without septic shock: Secondary | ICD-10-CM

## 2018-11-30 DIAGNOSIS — J690 Pneumonitis due to inhalation of food and vomit: Secondary | ICD-10-CM

## 2018-11-30 DIAGNOSIS — B259 Cytomegaloviral disease, unspecified: Secondary | ICD-10-CM

## 2018-11-30 DIAGNOSIS — J9621 Acute and chronic respiratory failure with hypoxia: Secondary | ICD-10-CM

## 2018-11-30 DIAGNOSIS — N170 Acute kidney failure with tubular necrosis: Secondary | ICD-10-CM

## 2018-11-30 NOTE — Progress Notes (Signed)
[facility]  PROGRESS NOTE  PULMONARY SERVICE ROUNDS  Date of Service: 11/30/2018  Kelsey Allen  DOB: 09-Nov-1970  Referring physician: Larena Glassman, MD  HPI: Kelsey Allen is a 48 y.o. female  being seen for Acute on Chronic Respiratory Failure.  Patient had a significant decline overnight she was placed back on the ventilator.  ABG drawn after being placed on the ventilator pH 7.36 PCO2 37 PO2 79  Review of Systems: Unremarkable other than noted in HPI  Allergies:  Reviewed on the [facility]  Medications: Reviewed  Vitals: Temperature 98.3 pulse 115 respiratory rate 20 blood pressure 150/84 saturations 100%  Ventilator Settings: Mode ventilation assist control FiO2 40% tidal line 326 PEEP 5  Physical Exam: . General:  calm and comfortable NAD . Eyes: normal lids, irises & conjunctiva . ENT: grossly normal tongue not enlarged . Neck: no masses . Cardiovascular: S1 S2 Normal no rubs no gallop . Respiratory: Scattered rhonchi expansion is equal . Abdomen: soft non-distended . Skin: no rash seen on limited exam . Musculoskeletal:  no rigidity . Psychiatric: unable to assess . Neurologic: no involuntary movements          Lab Data and radiological Data:  ABG pH 7.36 PCO2 is 37 PO2 79 Sodium 148 potassium 3.1 BUN 116 creatinine 2.1 White count 34.2 platelet count 11.2 hematocrit 34.4 platelet count 360   Assessment/Plan  Patient Active Problem List   Diagnosis Date Noted  . Acute on chronic respiratory failure with hypoxia (HCC)   . Aspiration pneumonia due to gastric secretions (HCC)   . Severe sepsis (HCC)   . Cytomegalovirus (CMV) viremia (HCC)   . Acute renal failure superimposed on chronic kidney disease (HCC)       1. Acute on chronic respiratory failure with hypoxia we will continue with full support on assist control mode currently with 40% FiO2 and PEEP 5 2. Aspiration pneumonia patient has had worsening of the infiltrate on the chest  x-ray as well as the CT scan.  Spoke with the primary care team I think that because of her high risk of complications from the organ transplant she should be transferred back to [facility]. 3. Severe sepsis right now is hemodynamically stable but status is tenuous 4. CMV viremia treated 5. Acute renal failure on chronic kidney disease patient's dialysis currently being held by nephrology   I have personally evaluated the patient, evaluated the laboratory and imaging results and formulated the assessment and plan and placed orders as needed.  Time 35 minutes extended discussion with primary care team as well as multidisciplinary rounds The Patient requires high complexity decision making for assessment and support. I have discussed the patient on rounds with the Respiratory Staff   Yevonne Pax, MD [facility] Pulmonary Critical Care Medicine

## 2018-12-01 ENCOUNTER — Other Ambulatory Visit: Payer: Self-pay | Admitting: Internal Medicine

## 2018-12-01 DIAGNOSIS — A419 Sepsis, unspecified organism: Secondary | ICD-10-CM

## 2018-12-01 DIAGNOSIS — N170 Acute kidney failure with tubular necrosis: Secondary | ICD-10-CM

## 2018-12-01 DIAGNOSIS — B259 Cytomegaloviral disease, unspecified: Secondary | ICD-10-CM

## 2018-12-01 DIAGNOSIS — N183 Chronic kidney disease, stage 3 unspecified: Secondary | ICD-10-CM

## 2018-12-01 DIAGNOSIS — J9621 Acute and chronic respiratory failure with hypoxia: Secondary | ICD-10-CM

## 2018-12-01 DIAGNOSIS — J690 Pneumonitis due to inhalation of food and vomit: Secondary | ICD-10-CM

## 2018-12-01 DIAGNOSIS — R6520 Severe sepsis without septic shock: Secondary | ICD-10-CM

## 2018-12-01 NOTE — Progress Notes (Signed)
[facility]  PROGRESS NOTE  PULMONARY SERVICE ROUNDS  Date of Service: 12/01/2018  Kelsey Allen  DOB: 02/01/1971  Referring physician: Larena Glassman, MD  HPI: Kelsey Allen is a 48 y.o. female  being seen for Acute on Chronic Respiratory Failure.  Patient is on full support assist control mode.  Awaiting transfer to [facility] for [facility] evaluation.  Covid test is pending  Review of Systems: Unremarkable other than noted in HPI  Allergies:  Reviewed on the [facility]  Medications: Reviewed  Vitals: Temperature 97.4 pulse 99 respiratory 20 blood pressure 124/78 saturations 100%  Ventilator Settings: Mode of ventilation assist control FiO2 30% tidal volume 350 PEEP 5  Physical Exam: . General:  calm and comfortable NAD . Eyes: normal lids, irises & conjunctiva . ENT: grossly normal tongue not enlarged . Neck: no masses . Cardiovascular: S1 S2 Normal no rubs no gallop . Respiratory: No rhonchi or rales are noted at this time . Abdomen: soft non-distended . Skin: no rash seen on limited exam . Musculoskeletal:  no rigidity . Psychiatric: unable to assess . Neurologic: no involuntary movements          Lab Data and radiological Data:  ABG pH 7.40 PCO2 30 PO2 114   Assessment/Plan  Patient Active Problem List   Diagnosis Date Noted  . Acute on chronic respiratory failure with hypoxia (HCC)   . Aspiration pneumonia due to gastric secretions (HCC)   . Severe sepsis (HCC)   . Cytomegalovirus (CMV) viremia (HCC)   . Acute renal failure superimposed on chronic kidney disease (HCC)       1. Acute on chronic respiratory failure with hypoxia patient remains on the ventilator now ABG does look better we can try to restart her weaning. 2. Aspiration pneumonia treated we will continue to monitor.  Worsening infiltrative noted on the CT and chest x-ray.  Awaiting transfer to acute care at [facility] 3. Severe sepsis hemodynamically she remains stable continue to monitor  closely 4. CMV viremia treated 5. Acute renal failure followed by nephrology will continue with supportive care   I have personally evaluated the patient, evaluated the laboratory and imaging results and formulated the assessment and plan and placed orders as needed. The Patient requires high complexity decision making for assessment and support. I have discussed the patient on rounds with the Respiratory Staff   Yevonne Pax, MD [facility] Pulmonary Critical Care Medicine

## 2019-01-02 DEATH — deceased

## 2019-10-03 IMAGING — CR DG CHEST 2V
2 series · 2 of 2 positions shown · non-contrast
Comparison: Chest radiograph dated 07/26/2006

CLINICAL DATA: 46-year-old female with cough.

EXAM:
CHEST  2 VIEW

[chest pa]
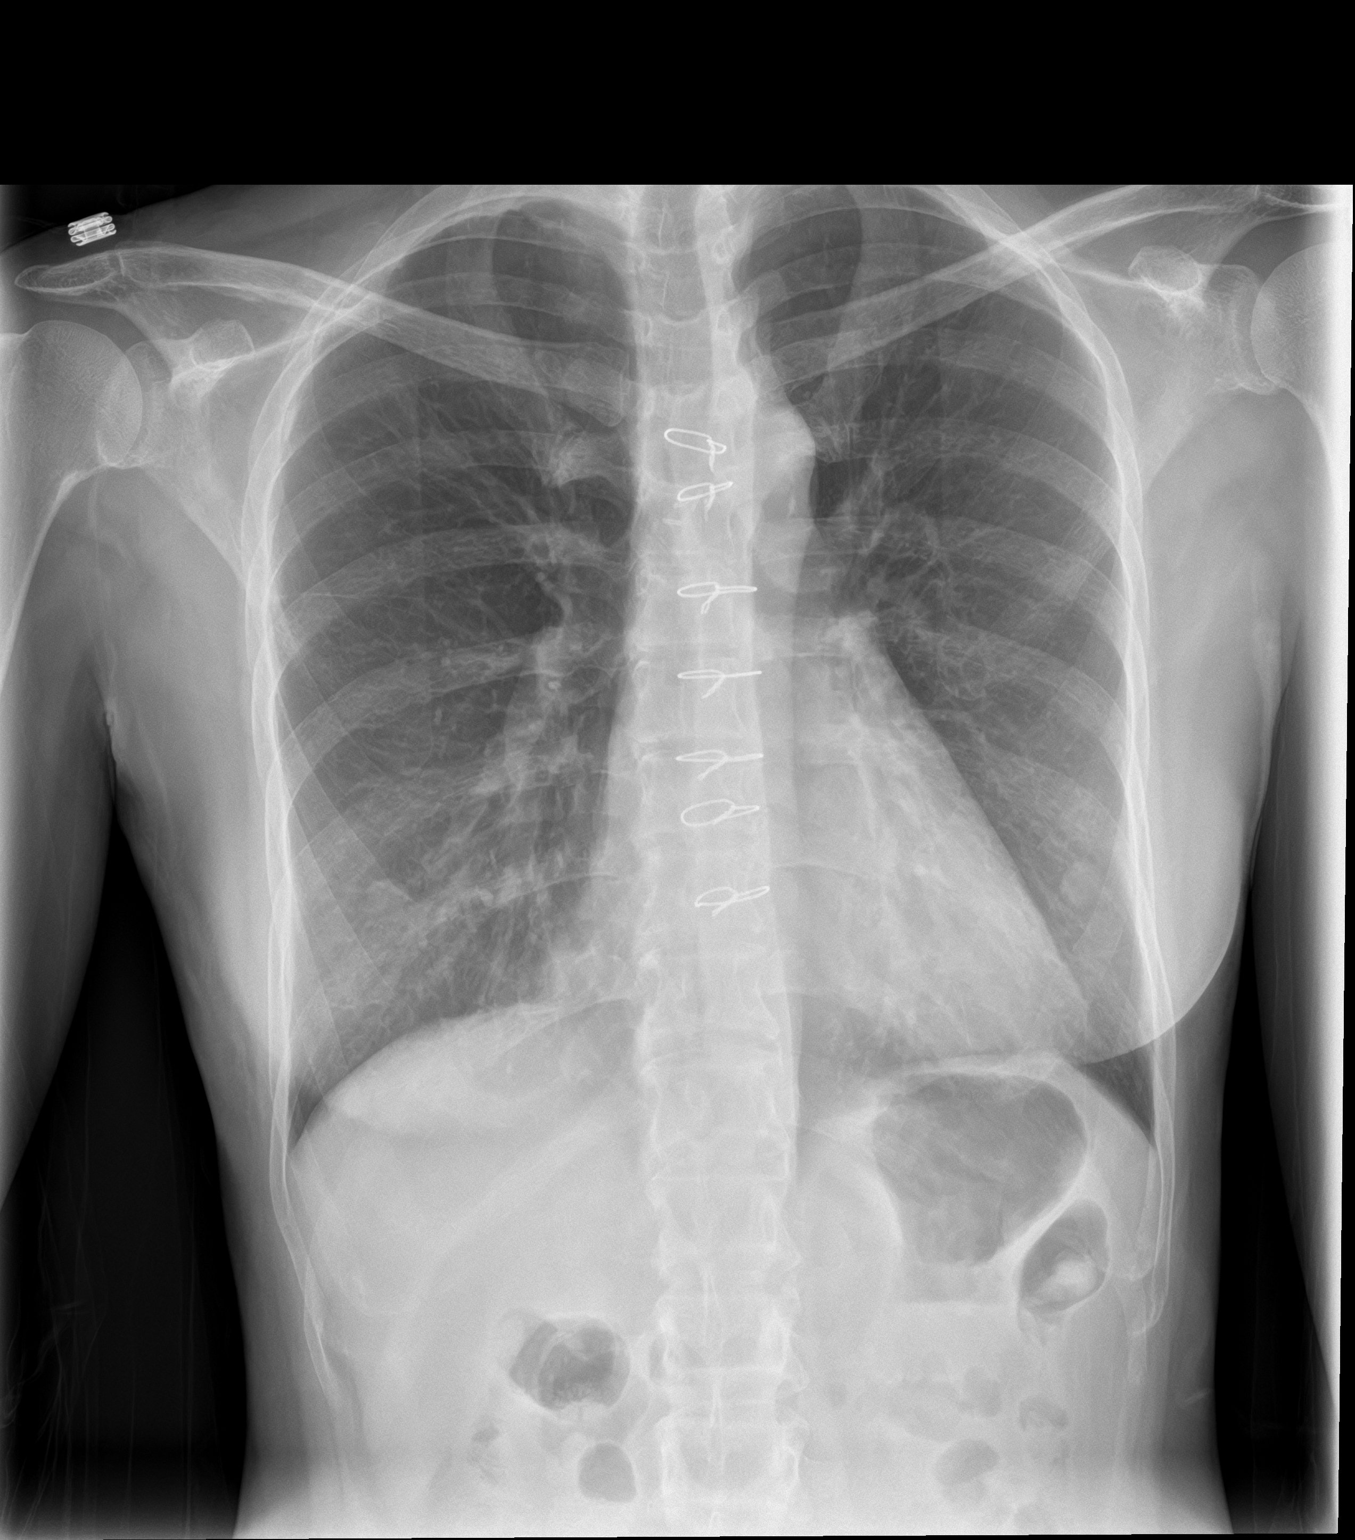

[chest lat]
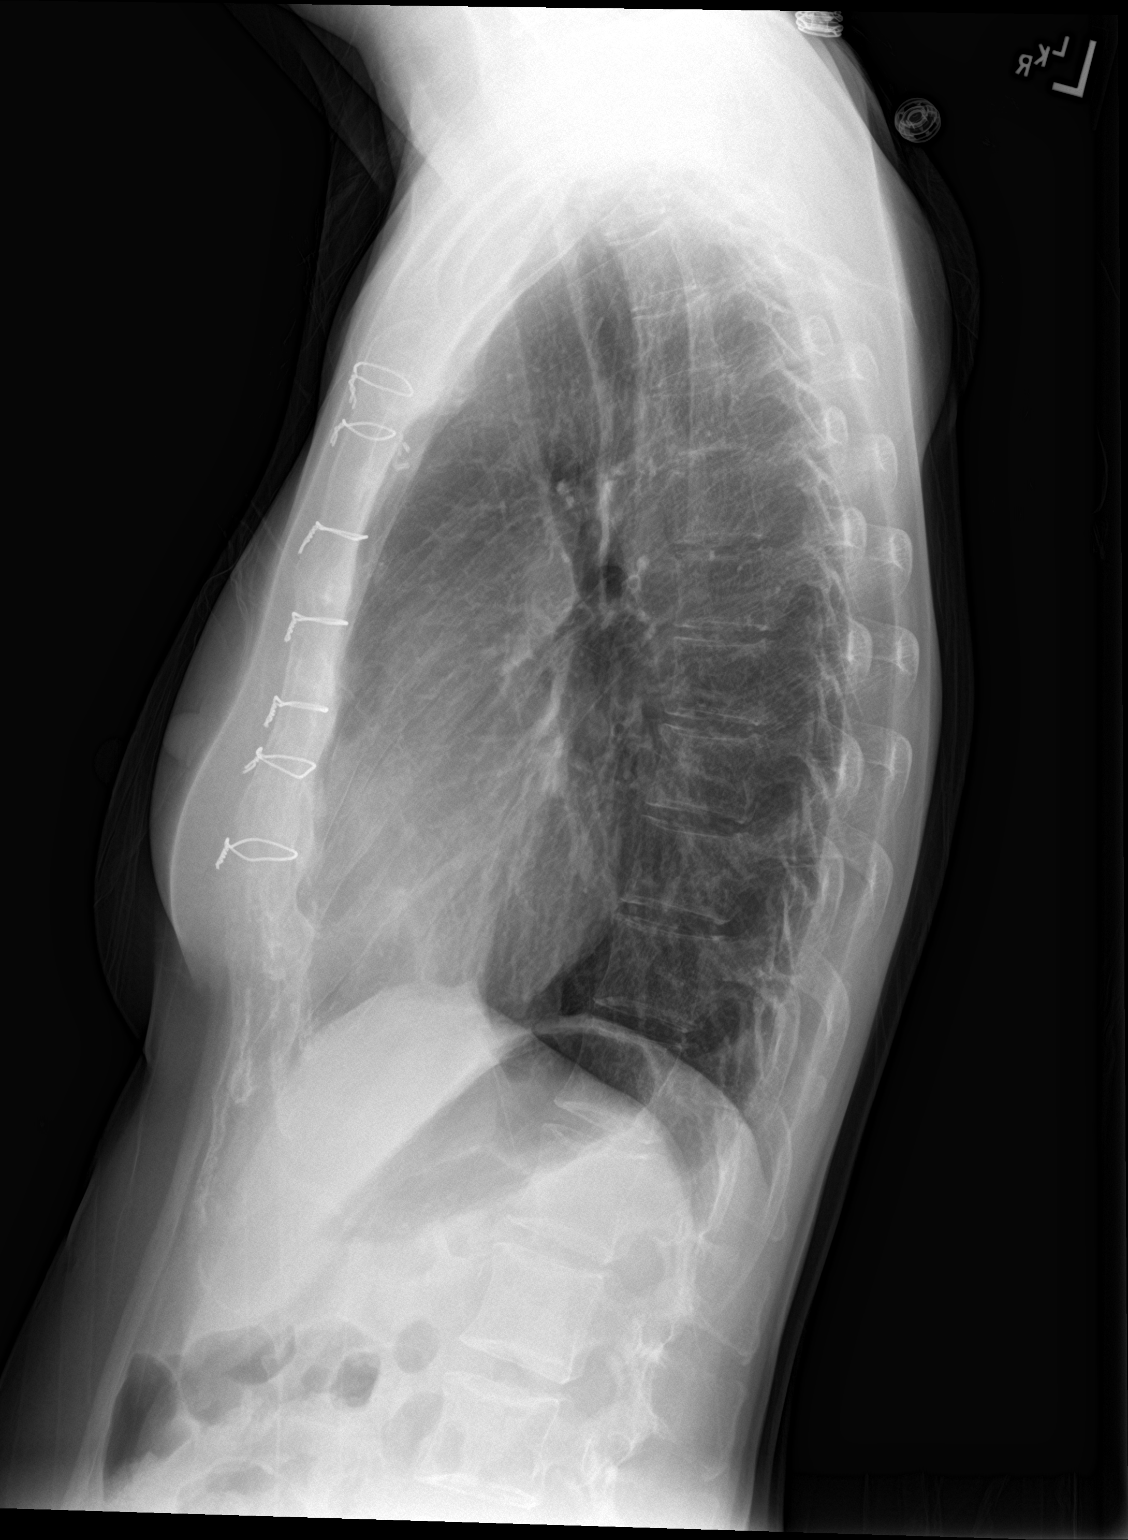

[2 of 2 positions shown; findings below may reference images not displayed]

FINDINGS: There is no focal consolidation, pleural effusion, or pneumothorax.
Mild streaky peribronchial densities, likely chronic. Bilateral
nipple shadows noted. The cardiac silhouette is within normal
limits. No acute osseous pathology. Median sternotomy wires.
IMPRESSION: No focal consolidation. Mild interstitial and peribronchial streaky
densities, likely chronic. Clinical correlation is recommended to
evaluate for bronchitis or less likely viral infection.

## 2020-09-10 IMAGING — US US RENAL TRANSPLANT
1 series · 14 of 25 positions shown · non-contrast
Comparison: None.

CLINICAL DATA: History of renal transplant.  Proteinuria.

EXAM:
ULTRASOUND OF RENAL TRANSPLANT WITH RENAL DOPPLER ULTRASOUND
TECHNIQUE: Ultrasound examination of the renal transplant was performed with
gray-scale, color and duplex doppler evaluation.

[Series 1: us renal transplant · 14 of 53 slices shown]
[im 1/53]
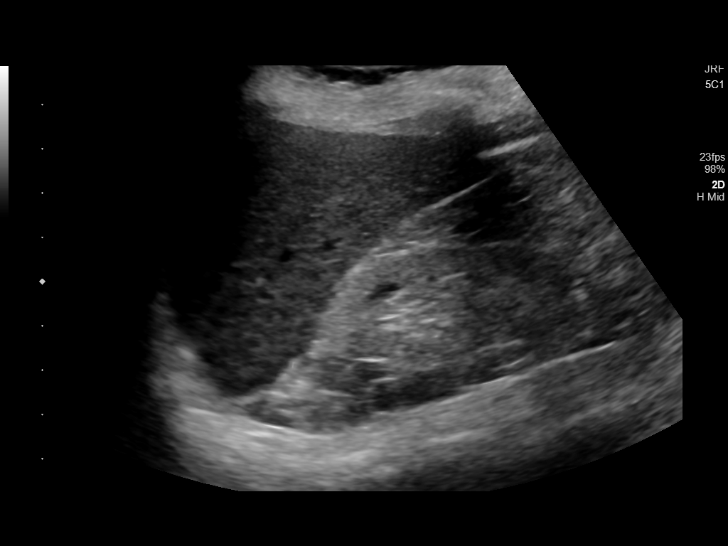
[im 5/53]
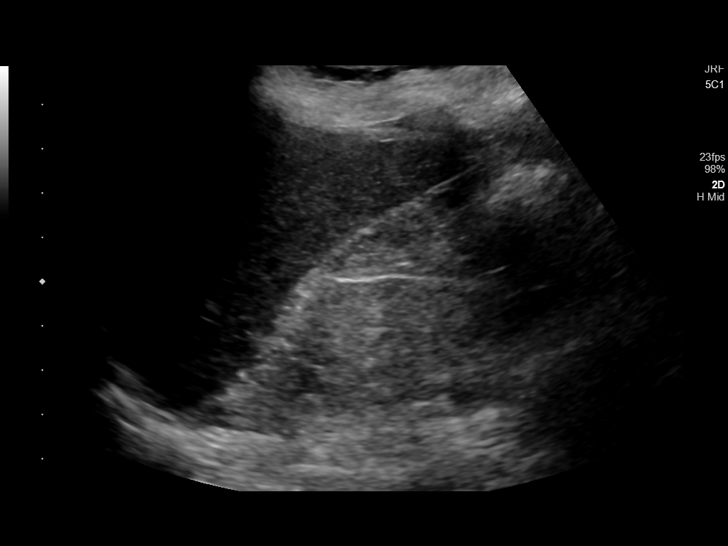
[im 9/53]
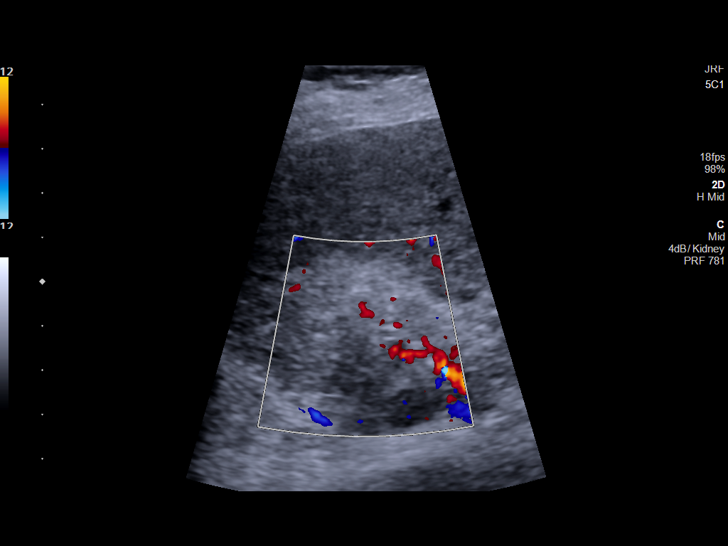
[im 14/53]
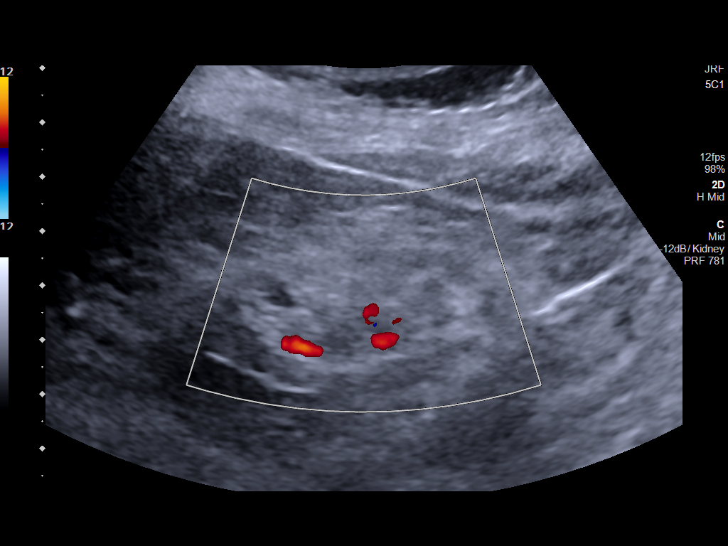
[im 18/53]
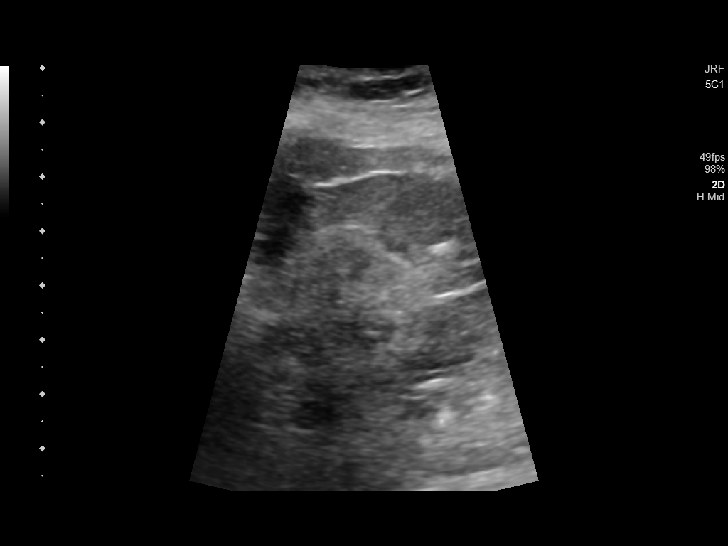
[im 20/53]
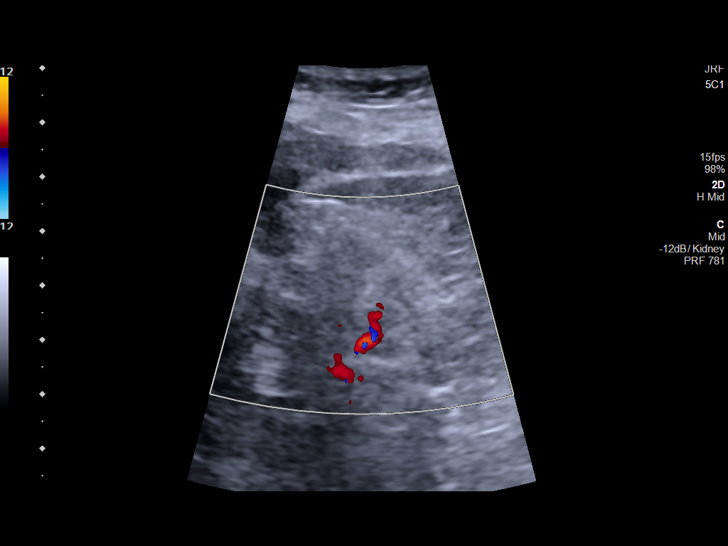
[im 24/53]
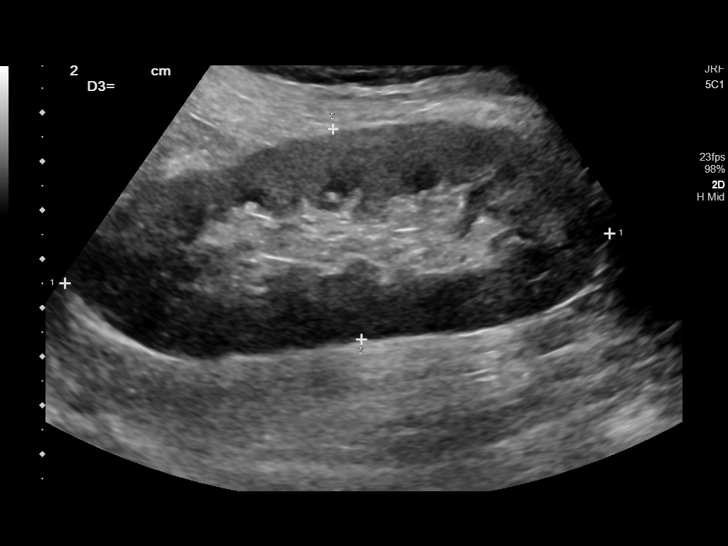
[im 29/53]
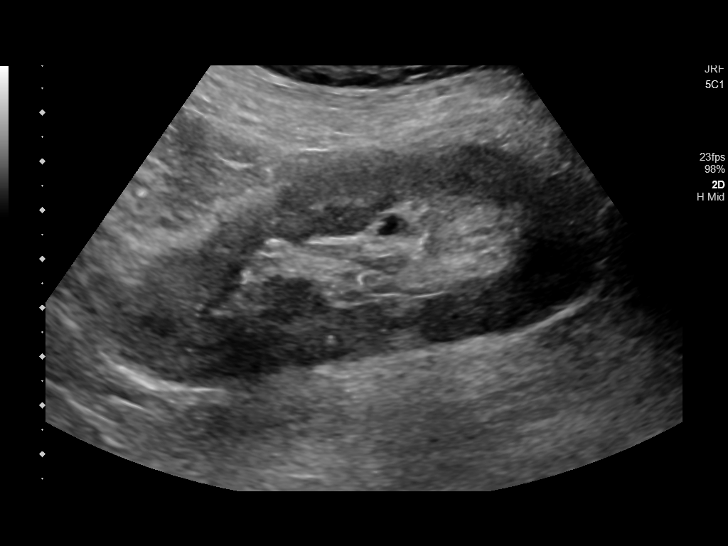
[im 33/53]
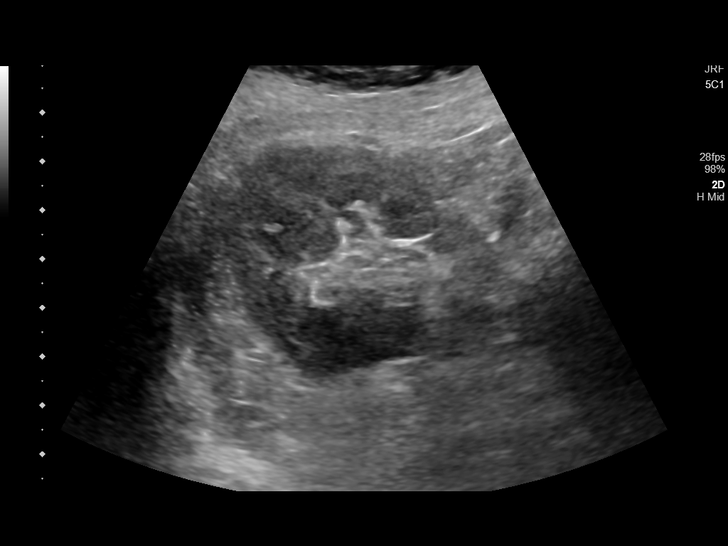
[im 35/53]
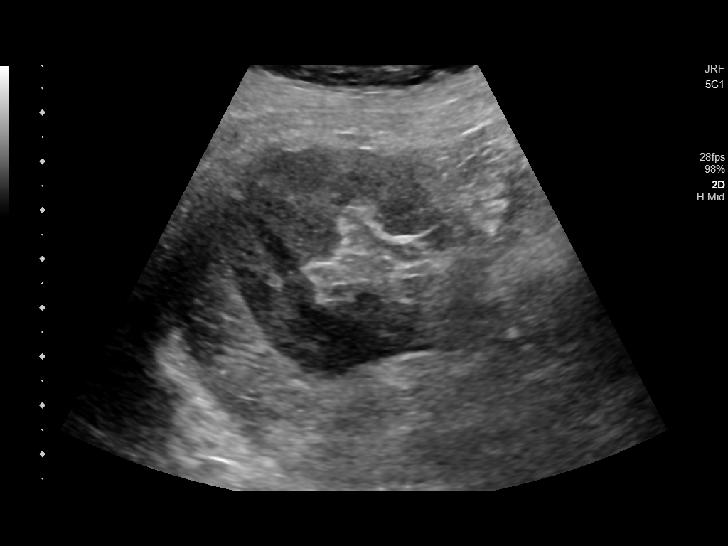
[im 40/53]
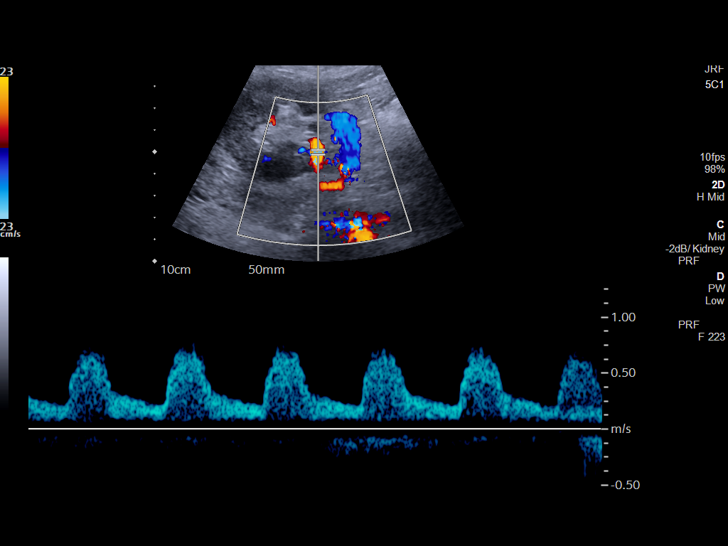
[im 44/53]
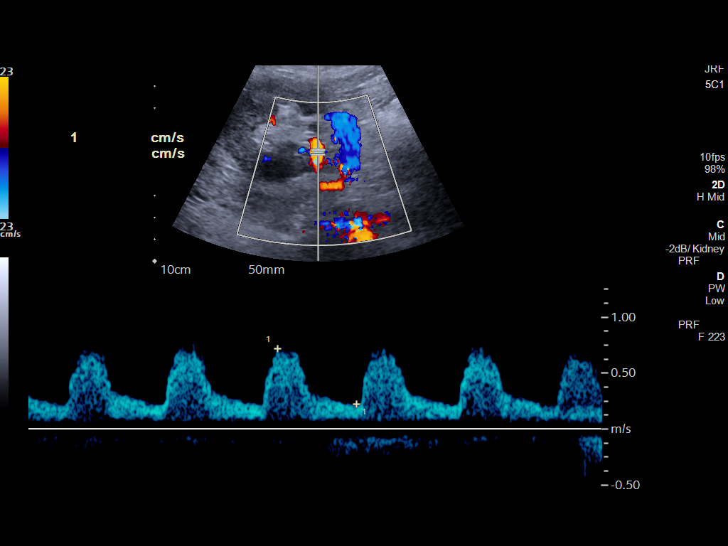
[im 48/53]
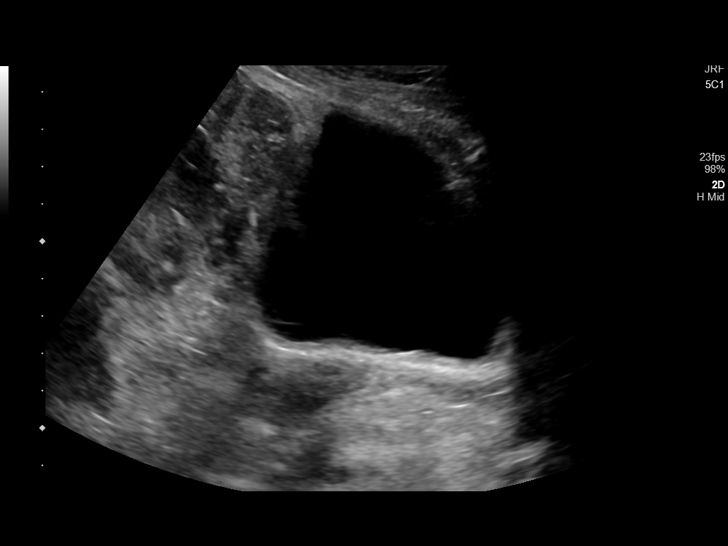
[im 53/53]
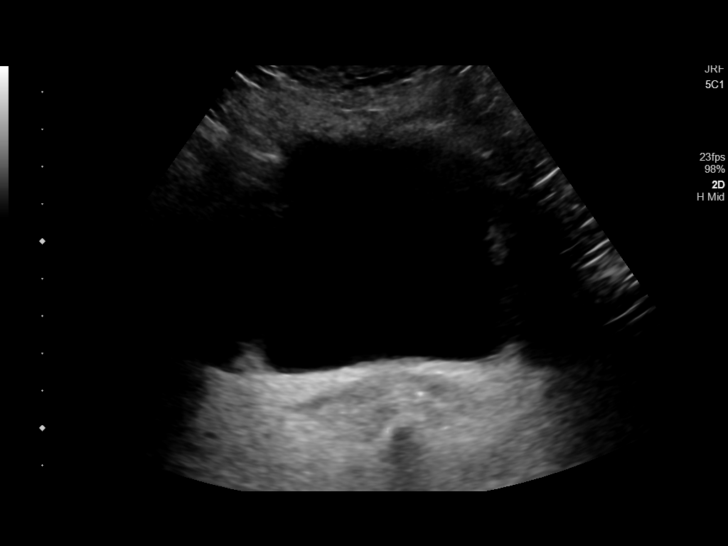

[14 of 25 positions shown; findings below may reference images not displayed]

FINDINGS: Transplant kidney location: Right lower quadrant

Transplant Kidney:

Renal measurements: 11.2 x 4.4 x 4.7 cm = volume: 118.7mL. Normal in
size and parenchymal echogenicity. No evidence of mass or
hydronephrosis. No peri-transplant fluid collection seen.

Color flow in the main renal artery:  Yes

Color flow in the main renal vein:  Yes

Duplex Doppler Evaluation:

Main Renal Artery Resistive Index:

Venous waveform in main renal vein:  Present

Intrarenal resistive index in upper pole:

(normal 0.6-0.8; equivocal 0.8-0.9; abnormal >= 0.9)

Intrarenal resistive index in lower pole:

(normal 0.6-0.8; equivocal 0.8-0.9; abnormal >= 0.9)

Bladder: Normal for degree of bladder distention.

Other findings:  No perinephric fluid collections.
IMPRESSION: Right lower quadrant renal transplant is within normal limits. No
evidence of hydronephrosis. No perinephric fluid collection.
